# Patient Record
Sex: Male | Born: 1975
Health system: Southern US, Community
[De-identification: ages and names within clinical notes are randomized; demographics above are authoritative.]

## PROBLEM LIST (undated history)

## (undated) DIAGNOSIS — R519 Headache, unspecified: Secondary | ICD-10-CM

## (undated) DIAGNOSIS — R05 Cough: Secondary | ICD-10-CM

## (undated) DIAGNOSIS — R059 Cough, unspecified: Secondary | ICD-10-CM

## (undated) DIAGNOSIS — R0602 Shortness of breath: Secondary | ICD-10-CM

## (undated) HISTORY — DX: Shortness of breath: R06.02

## (undated) HISTORY — DX: Headache, unspecified: R51.9

## (undated) HISTORY — DX: Cough, unspecified: R05.9

## (undated) HISTORY — PX: APPENDECTOMY: SHX54

## (undated) HISTORY — PX: OTHER SURGICAL HISTORY: SHX169

---
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
## 2005-03-21 ENCOUNTER — Emergency Department (HOSPITAL_COMMUNITY): Admission: EM | Admit: 2005-03-21 | Discharge: 2005-03-21 | Payer: Self-pay | Admitting: Emergency Medicine

## 2012-06-21 ENCOUNTER — Emergency Department (HOSPITAL_COMMUNITY): Payer: 59 | Admitting: Anesthesiology

## 2012-06-21 ENCOUNTER — Encounter (HOSPITAL_COMMUNITY): Payer: Self-pay | Admitting: Anesthesiology

## 2012-06-21 ENCOUNTER — Encounter (HOSPITAL_COMMUNITY)
Admission: EM | Disposition: A | Discharge status: Home / Self Care | Payer: Self-pay | Source: Home / Self Care | Attending: Emergency Medicine

## 2012-06-21 ENCOUNTER — Other Ambulatory Visit: Payer: Self-pay | Admitting: Family Medicine

## 2012-06-21 ENCOUNTER — Ambulatory Visit
Admission: RE | Admit: 2012-06-21 | Discharge: 2012-06-21 | Disposition: A | Discharge status: Home / Self Care | Payer: 59 | Source: Ambulatory Visit | Attending: Family Medicine | Admitting: Family Medicine

## 2012-06-21 ENCOUNTER — Observation Stay (HOSPITAL_COMMUNITY)
Admission: EM | Admit: 2012-06-21 | Discharge: 2012-06-22 | Disposition: A | Discharge status: Home / Self Care | Payer: 59 | Attending: General Surgery | Admitting: General Surgery

## 2012-06-21 ENCOUNTER — Encounter (HOSPITAL_COMMUNITY): Payer: Self-pay | Admitting: *Deleted

## 2012-06-21 DIAGNOSIS — R1031 Right lower quadrant pain: Secondary | ICD-10-CM

## 2012-06-21 DIAGNOSIS — K3580 Unspecified acute appendicitis: Principal | ICD-10-CM | POA: Insufficient documentation

## 2012-06-21 HISTORY — DX: Unspecified acute appendicitis: K35.80

## 2012-06-21 HISTORY — PX: LAPAROSCOPIC APPENDECTOMY: SHX408

## 2012-06-21 LAB — COMPREHENSIVE METABOLIC PANEL
Albumin: 4.5 g/dL (ref 3.5–5.2)
Alkaline Phosphatase: 62 U/L (ref 39–117)
BUN: 11 mg/dL (ref 6–23)
Chloride: 99 mEq/L (ref 96–112)
Creatinine, Ser: 1.59 mg/dL — ABNORMAL HIGH (ref 0.50–1.35)
GFR calc Af Amer: 63 mL/min — ABNORMAL LOW (ref >90)
GFR calc non Af Amer: 55 mL/min — ABNORMAL LOW (ref >90)
Glucose, Bld: 93 mg/dL (ref 70–99)
Potassium: 4.3 mEq/L (ref 3.5–5.1)
Total Bilirubin: 1.2 mg/dL (ref 0.3–1.2)

## 2012-06-21 LAB — URINALYSIS, ROUTINE W REFLEX MICROSCOPIC
Bilirubin Urine: NEGATIVE
Glucose, UA: NEGATIVE mg/dL
Ketones, ur: NEGATIVE mg/dL
Specific Gravity, Urine: <1.005 — ABNORMAL LOW (ref 1.005–1.030)
pH: 6.5 (ref 5.0–8.0)

## 2012-06-21 LAB — CBC WITH DIFFERENTIAL/PLATELET
Basophils Relative: 0 % (ref 0–1)
Eosinophils Absolute: 0 10*3/uL (ref 0.0–0.7)
HCT: 42.1 % (ref 39.0–52.0)
Hemoglobin: 14.8 g/dL (ref 13.0–17.0)
Lymphs Abs: 1.9 10*3/uL (ref 0.7–4.0)
MCH: 31.1 pg (ref 26.0–34.0)
MCHC: 35.2 g/dL (ref 30.0–36.0)
Monocytes Absolute: 0.9 10*3/uL (ref 0.1–1.0)
Monocytes Relative: 7 % (ref 3–12)
Neutro Abs: 9.4 10*3/uL — ABNORMAL HIGH (ref 1.7–7.7)
Neutrophils Relative %: 77 % (ref 43–77)
RBC: 4.76 MIL/uL (ref 4.22–5.81)

## 2012-06-21 SURGERY — APPENDECTOMY, LAPAROSCOPIC
Anesthesia: General | Site: Abdomen | Wound class: Contaminated

## 2012-06-21 MED ORDER — PROMETHAZINE HCL 25 MG/ML IJ SOLN: 6.2500 mg | INTRAMUSCULAR | Status: DC | PRN

## 2012-06-21 MED ORDER — OXYCODONE HCL 5 MG/5ML PO SOLN: 5.0000 mg | Freq: Once | ORAL | Status: DC | PRN

## 2012-06-21 MED ORDER — IOHEXOL 300 MG/ML  SOLN
30.0000 mL | Freq: Once | INTRAMUSCULAR | Status: AC | PRN
  Administered 2012-06-21: 30 mL via ORAL

## 2012-06-21 MED ORDER — ONDANSETRON HCL 4 MG/2ML IJ SOLN: 4.0000 mg | Freq: Four times a day (QID) | INTRAMUSCULAR | Status: DC | PRN

## 2012-06-21 MED ORDER — IOHEXOL 300 MG/ML  SOLN
125.0000 mL | Freq: Once | INTRAMUSCULAR | Status: AC | PRN
  Administered 2012-06-21: 125 mL via INTRAVENOUS

## 2012-06-21 MED ORDER — ONDANSETRON HCL 4 MG/2ML IJ SOLN
INTRAMUSCULAR | Status: DC | PRN
  Administered 2012-06-21: 4 mg via INTRAVENOUS

## 2012-06-21 MED ORDER — MEPERIDINE HCL 25 MG/ML IJ SOLN: 6.2500 mg | INTRAMUSCULAR | Status: DC | PRN

## 2012-06-21 MED ORDER — MORPHINE SULFATE 4 MG/ML IJ SOLN: 4.0000 mg | INTRAMUSCULAR | Status: DC | PRN

## 2012-06-21 MED ORDER — MIDAZOLAM HCL 5 MG/5ML IJ SOLN
INTRAMUSCULAR | Status: DC | PRN
  Administered 2012-06-21: 2 mg via INTRAVENOUS

## 2012-06-21 MED ORDER — NEOSTIGMINE METHYLSULFATE 1 MG/ML IJ SOLN
INTRAMUSCULAR | Status: DC | PRN
  Administered 2012-06-21: 5 mg via INTRAVENOUS

## 2012-06-21 MED ORDER — ROCURONIUM BROMIDE 100 MG/10ML IV SOLN
INTRAVENOUS | Status: DC | PRN
  Administered 2012-06-21: 20 mg via INTRAVENOUS
  Administered 2012-06-21: 30 mg via INTRAVENOUS

## 2012-06-21 MED ORDER — SODIUM CHLORIDE 0.9 % IR SOLN
Status: DC | PRN
  Administered 2012-06-21: 1000 mL

## 2012-06-21 MED ORDER — FENTANYL CITRATE 0.05 MG/ML IJ SOLN
INTRAMUSCULAR | Status: DC | PRN
  Administered 2012-06-21: 50 ug via INTRAVENOUS
  Administered 2012-06-21 (×2): 100 ug via INTRAVENOUS

## 2012-06-21 MED ORDER — SUCCINYLCHOLINE CHLORIDE 20 MG/ML IJ SOLN
INTRAMUSCULAR | Status: DC | PRN
  Administered 2012-06-21: 100 mg via INTRAVENOUS

## 2012-06-21 MED ORDER — BUPIVACAINE-EPINEPHRINE 0.25% -1:200000 IJ SOLN
INTRAMUSCULAR | Status: DC | PRN
  Administered 2012-06-21: 30 mL

## 2012-06-21 MED ORDER — ONDANSETRON HCL 4 MG PO TABS: 4.0000 mg | ORAL_TABLET | Freq: Four times a day (QID) | ORAL | Status: DC | PRN

## 2012-06-21 MED ORDER — GLYCOPYRROLATE 0.2 MG/ML IJ SOLN
INTRAMUSCULAR | Status: DC | PRN
  Administered 2012-06-21: 1 mg via INTRAVENOUS

## 2012-06-21 MED ORDER — PROPOFOL 10 MG/ML IV BOLUS
INTRAVENOUS | Status: DC | PRN
  Administered 2012-06-21: 200 mg via INTRAVENOUS

## 2012-06-21 MED ORDER — LACTATED RINGERS IV SOLN
INTRAVENOUS | Status: DC | PRN
  Administered 2012-06-21 (×2): via INTRAVENOUS

## 2012-06-21 MED ORDER — HYDROMORPHONE HCL PF 1 MG/ML IJ SOLN: 0.2500 mg | INTRAMUSCULAR | Status: DC | PRN

## 2012-06-21 MED ORDER — LIDOCAINE HCL (CARDIAC) 20 MG/ML IV SOLN
INTRAVENOUS | Status: DC | PRN
  Administered 2012-06-21: 100 mg via INTRAVENOUS

## 2012-06-21 MED ORDER — KCL IN DEXTROSE-NACL 20-5-0.9 MEQ/L-%-% IV SOLN
INTRAVENOUS | Status: DC
  Administered 2012-06-22 (×2): via INTRAVENOUS
  Filled 2012-06-21 (×4): qty 1000

## 2012-06-21 MED ORDER — SODIUM CHLORIDE 0.9 % IV SOLN
3.0000 g | Freq: Once | INTRAVENOUS | Status: AC
  Administered 2012-06-21: 3 g via INTRAVENOUS
  Filled 2012-06-21: qty 3

## 2012-06-21 MED ORDER — OXYCODONE HCL 5 MG PO TABS: 5.0000 mg | ORAL_TABLET | Freq: Once | ORAL | Status: DC | PRN

## 2012-06-21 MED ORDER — PANTOPRAZOLE SODIUM 40 MG IV SOLR
40.0000 mg | INTRAVENOUS | Status: DC
  Administered 2012-06-21: 40 mg via INTRAVENOUS
  Filled 2012-06-21 (×2): qty 40

## 2012-06-21 SURGICAL SUPPLY — 40 items
ADH SKN CLS APL DERMABOND .7 (GAUZE/BANDAGES/DRESSINGS) ×1
APPLIER CLIP ROT 10 11.4 M/L (STAPLE) ×2
APR CLP MED LRG 11.4X10 (STAPLE) ×1
BAG SPEC RTRVL LRG 6X4 10 (ENDOMECHANICALS) ×1
BLADE SURG ROTATE 9660 (MISCELLANEOUS) ×2 IMPLANT
CANISTER SUCTION 2500CC (MISCELLANEOUS) ×2 IMPLANT
CHLORAPREP W/TINT 26ML (MISCELLANEOUS) ×2 IMPLANT
CLIP APPLIE ROT 10 11.4 M/L (STAPLE) ×1 IMPLANT
CLOTH BEACON ORANGE TIMEOUT ST (SAFETY) ×2 IMPLANT
COVER SURGICAL LIGHT HANDLE (MISCELLANEOUS) ×2 IMPLANT
CUTTER FLEX LINEAR 45M (STAPLE) ×2 IMPLANT
DECANTER SPIKE VIAL GLASS SM (MISCELLANEOUS) ×2 IMPLANT
DERMABOND ADVANCED (GAUZE/BANDAGES/DRESSINGS) ×1
DERMABOND ADVANCED .7 DNX12 (GAUZE/BANDAGES/DRESSINGS) ×1 IMPLANT
DRAPE UTILITY 15X26 W/TAPE STR (DRAPE) ×4 IMPLANT
ELECT REM PT RETURN 9FT ADLT (ELECTROSURGICAL) ×2
ELECTRODE REM PT RTRN 9FT ADLT (ELECTROSURGICAL) ×1 IMPLANT
ENDOLOOP SUT PDS II  0 18 (SUTURE)
ENDOLOOP SUT PDS II 0 18 (SUTURE) IMPLANT
GLOVE BIO SURGEON STRL SZ7.5 (GLOVE) ×2 IMPLANT
GLOVE BIOGEL PI IND STRL 8 (GLOVE) ×2 IMPLANT
GLOVE BIOGEL PI INDICATOR 8 (GLOVE) ×2
GOWN STRL NON-REIN LRG LVL3 (GOWN DISPOSABLE) ×4 IMPLANT
KIT BASIN OR (CUSTOM PROCEDURE TRAY) ×2 IMPLANT
KIT ROOM TURNOVER OR (KITS) ×2 IMPLANT
NS IRRIG 1000ML POUR BTL (IV SOLUTION) ×2 IMPLANT
PAD ARMBOARD 7.5X6 YLW CONV (MISCELLANEOUS) ×4 IMPLANT
POUCH SPECIMEN RETRIEVAL 10MM (ENDOMECHANICALS) ×2 IMPLANT
RELOAD STAPLE TA45 3.5 REG BLU (ENDOMECHANICALS) ×2 IMPLANT
SCALPEL HARMONIC ACE (MISCELLANEOUS) ×2 IMPLANT
SET IRRIG TUBING LAPAROSCOPIC (IRRIGATION / IRRIGATOR) ×2 IMPLANT
SPECIMEN JAR SMALL (MISCELLANEOUS) ×2 IMPLANT
SUT MNCRL AB 4-0 PS2 18 (SUTURE) ×2 IMPLANT
TOWEL OR 17X24 6PK STRL BLUE (TOWEL DISPOSABLE) ×2 IMPLANT
TOWEL OR 17X26 10 PK STRL BLUE (TOWEL DISPOSABLE) ×2 IMPLANT
TRAY FOLEY CATH 14FR (SET/KITS/TRAYS/PACK) ×2 IMPLANT
TRAY LAPAROSCOPIC (CUSTOM PROCEDURE TRAY) ×2 IMPLANT
TROCAR XCEL BLUNT TIP 100MML (ENDOMECHANICALS) ×2 IMPLANT
TROCAR XCEL NON-BLD 5MMX100MML (ENDOMECHANICALS) ×4 IMPLANT
WATER STERILE IRR 1000ML POUR (IV SOLUTION) IMPLANT

## 2012-06-21 NOTE — ED Notes (Signed)
Patient said he started with the pain three days ago.  He started being nauseous and vomiting yesterday and he went to [facility] and he was told he had a virus.  He continued to hurt and the wife took him to [facility] practice and they sent him to get a CT scan.  There he was told his appendix had ruptured and he needed to come to the ED to get it taken care of.

## 2012-06-21 NOTE — Transfer of Care (Signed)
Immediate Anesthesia Transfer of Care Note  Patient: Bruce Bailey  Procedure(s) Performed: Procedure(s): APPENDECTOMY LAPAROSCOPIC (N/A)  Patient Location: PACU  Anesthesia Type:General  Level of Consciousness: awake and alert   Airway & Oxygen Therapy: Patient Spontanous Breathing and Patient connected to nasal cannula oxygen  Post-op Assessment: Report given to PACU RN and Post -op Vital signs reviewed and stable  Post vital signs: Reviewed and stable  Complications: No apparent anesthesia complications

## 2012-06-21 NOTE — Op Note (Signed)
06/21/2012  8:39 PM  PATIENT:  Bruce Bailey  37 y.o. male  PRE-OPERATIVE DIAGNOSIS:  appendicitis  POST-OPERATIVE DIAGNOSIS:  appendicitis  PROCEDURE:  Procedure(s): APPENDECTOMY LAPAROSCOPIC (N/A)  SURGEON:  Surgeon(s) and Role:    * Robyne Askew, MD - Primary  PHYSICIAN ASSISTANT:   ASSISTANTS: none   ANESTHESIA:   general  EBL:  Total I/O In: 1000 [I.V.:1000] Out: 300 [Urine:300]  BLOOD ADMINISTERED:none  DRAINS: none   LOCAL MEDICATIONS USED:  MARCAINE     SPECIMEN:  Source of Specimen:  appendix  DISPOSITION OF SPECIMEN:  PATHOLOGY  COUNTS:  YES  TOURNIQUET:  * No tourniquets in log *  DICTATION: .Dragon Dictation After informed consent was obtained patient was brought to the operating room placed in the supine position on the operating room table. After adequate induction of general anesthesia the patient's abdomen was prepped with ChloraPrep, allowed to dry, and draped in usual sterile manner. The area below the umbilicus was infiltrated with quarter percent Marcaine. A small incision was made with a 15 blade knife. This incision was carried down through the subcutaneous tissue bluntly with a hemostat and Army-Nav[employer] retractors until the linea alba was identified. The linea alba was incised with a 15 blade knife. Each side was grasped Coker clamps and elevated anteriorly. The preperitoneal space was probed bluntly with a hemostat until the peritoneum was opened and access was gained to the abdominal cavity. A 0 Vicryl purse string stitch was placed in the fascia surrounding the opening. A Hassan cannula was placed through the opening and anchored in place with the previously placed Vicryl purse string stitch. The laparoscope was placed through the [facility] cannula. The abdomen was insufflated with carbon dioxide without difficulty. nnext the suprapubic area was infiltrated with quarter percent Marcaine. A small incision was made with a 15 blade knife. A 5 mm port was  placed bluntly through this incision into the abdominal cavity. A site was then chosen between the 2 port for placement of a 5 mm port. The area was infiltrated with quarter percent Marcaine. A small stab incision was made with a 15 blade knife. A 5 mm port was placed bluntly through this incision and the abdominal cavity under direct vision. The laparoscope was then moved to the suprapubic port. Using a Glassman grasper and harmonic scalpel the right lower quadrant was inspected. The appendix was readily identified. The appendix was elevated anteriorly and the mesoappendix was taken down sharply with the harmonic scalpel. Once the base of the appendix where it joined the cecum was identified and cleared of any tissue then a laparoscopic GIA blue load 6 row stapler was placed through the [facility] cannula. The stapler was placed across the base of the appendix clamped and fired thereby dividing the base of the appendix between staple lines. A clip was placed across the staple line for hemostasis. A laparoscopic bag was then inserted through the [facility] cannula. The appendix was placed within the bag and the bag was sealed. The abdomen was then irrigated with copious amounts of saline until the effluent was clear. No other abnormalities were noted. The appendix and bag were removed with the [facility] cannula through the infraumbilical port without difficulty. The fascial defect was closed with the previously placed Vicryl pursestring stitch as well as with another interrupted 0 Vicryl figure-of-eight stitch. The rest of the ports were removed under direct vision and were found to be hemostatic. The gas was allowed to escape. The skin incisions were closed  with interrupted 4-0 Monocryl subcuticular stitches. Dermabond dressings were applied. The patient tolerated the procedure well. At the end of the case all needle sponge and instrument counts were correct. The patient was then awakened and taken to recovery in stable  condition.  PLAN OF CARE: Admit for overnight observation  PATIENT DISPOSITION:  PACU - hemodynamically stable.   Delay start of Pharmacological VTE agent (>24hrs) due to surgical blood loss or risk of bleeding: yes

## 2012-06-21 NOTE — H&P (Signed)
Bruce Bailey is an 37 y.o. male.   Chief Complaint: abdominal pain HPI: 37 yo bm who began having lower abdominal pain yesterday. Pain became severe. It has been associated with nausea and vomiting. He has had fevers. He came to ER. CT shows appendicitis but no perf.  History reviewed. No pertinent past medical history.  History reviewed. No pertinent past surgical history.  No family history on file. Social History:  reports that he has never smoked. He does not have any smokeless tobacco history on file. He reports that he does not drink alcohol. His drug history is not on file.  Allergies: No Known Allergies   (Not in a hospital admission)  Results for orders placed during the hospital encounter of 06/21/12 (from the past 48 hour(s))  URINALYSIS, ROUTINE W REFLEX MICROSCOPIC     Status: Abnormal   Collection Time    06/21/12  4:35 PM      Result Value Range   Color, Urine YELLOW  YELLOW   APPearance CLEAR  CLEAR   Specific Gravity, Urine <1.005 (*) 1.005 - 1.030   pH 6.5  5.0 - 8.0   Glucose, UA NEGATIVE  NEGATIVE mg/dL   Hgb urine dipstick NEGATIVE  NEGATIVE   Bilirubin Urine NEGATIVE  NEGATIVE   Ketones, ur NEGATIVE  NEGATIVE mg/dL   Protein, ur NEGATIVE  NEGATIVE mg/dL   Urobilinogen, UA 1.0  0.0 - 1.0 mg/dL   Nitrite NEGATIVE  NEGATIVE   Leukocytes, UA NEGATIVE  NEGATIVE   Comment: MICROSCOPIC NOT DONE ON URINES WITH NEGATIVE PROTEIN, BLOOD, LEUKOCYTES, NITRITE, OR GLUCOSE <1000 mg/dL.  CBC WITH DIFFERENTIAL     Status: Abnormal   Collection Time    06/21/12  5:01 PM      Result Value Range   WBC 12.2 (*) 4.0 - 10.5 K/uL   RBC 4.76  4.22 - 5.81 MIL/uL   Hemoglobin 14.8  13.0 - 17.0 g/dL   HCT 16.1  09.6 - 04.5 %   MCV 88.4  78.0 - 100.0 fL   MCH 31.1  26.0 - 34.0 pg   MCHC 35.2  30.0 - 36.0 g/dL   RDW 40.9  81.1 - 91.4 %   Platelets 181  150 - 400 K/uL   Neutrophils Relative 77  43 - 77 %   Neutro Abs 9.4 (*) 1.7 - 7.7 K/uL   Lymphocytes Relative 16  12 - 46 %    Lymphs Abs 1.9  0.7 - 4.0 K/uL   Monocytes Relative 7  3 - 12 %   Monocytes Absolute 0.9  0.1 - 1.0 K/uL   Eosinophils Relative 0  0 - 5 %   Eosinophils Absolute 0.0  0.0 - 0.7 K/uL   Basophils Relative 0  0 - 1 %   Basophils Absolute 0.0  0.0 - 0.1 K/uL  COMPREHENSIVE METABOLIC PANEL     Status: Abnormal   Collection Time    06/21/12  5:01 PM      Result Value Range   Sodium 138  135 - 145 mEq/L   Potassium 4.3  3.5 - 5.1 mEq/L   Chloride 99  96 - 112 mEq/L   CO2 31  19 - 32 mEq/L   Glucose, Bld 93  70 - 99 mg/dL   BUN 11  6 - 23 mg/dL   Creatinine, Ser 7.82 (*) 0.50 - 1.35 mg/dL   Calcium 9.7  8.4 - 95.6 mg/dL   Total Protein 7.7  6.0 - 8.3 g/dL  Albumin 4.5  3.5 - 5.2 g/dL   AST 47 (*) 0 - 37 U/L   ALT 41  0 - 53 U/L   Alkaline Phosphatase 62  39 - 117 U/L   Total Bilirubin 1.2  0.3 - 1.2 mg/dL   GFR calc non Af Amer 55 (*) >90 mL/min   GFR calc Af Amer 63 (*) >90 mL/min   Comment:            The eGFR has been calculated     using the CKD EPI equation.     This calculation has not been     validated in all clinical     situations.     eGFR's persistently     <90 mL/min signify     possible Chronic Kidney Disease.   Ct Abdomen Pelvis W Contrast  06/21/2012  *RADIOLOGY REPORT*  Clinical Data: Right lower quadrant pain and fever.  CT ABDOMEN AND PELVIS WITH CONTRAST  Technique:  Multidetector CT imaging of the abdomen and pelvis was performed following the standard protocol during bolus administration of intravenous contrast.  Contrast: 30mL OMNIPAQUE IOHEXOL 300 MG/ML  SOLN, OMNIPAQUE IOHEXOL 300 MG/ML  SOLN  Comparison: None.  Findings: The lung bases are clear without focal nodule, mass, or airspace disease.  The heart to size is upper limits of normal. There is no edema or effusions to suggest failure.  The liver and spleen are within normal limits.  The stomach, duodenum, and pancreas are within normal limits.  The common bile duct and gallbladder are normal.  The  adrenal glands are normal bilaterally.  The kidneys and ureters are unremarkable.  The rectosigmoid colon demonstrates mild diverticular changes without focal inflammation to suggest diverticulitis.  The more proximal colon is within normal limits.  The appendix is dilated, measuring up to 8.5 mm.  Inflammatory changes are present.  There is some free fluid extending into the dependent portions of the anatomic pelvis.  The urinary bladder is within normal limits.  No significant adenopathy is present.  The bone windows are unremarkable.  IMPRESSION:  1.  Findings compatible with acute appendicitis.  There is no evidence for abscess or free air. 2.  Layering fluid within the talar pelvis is likely reactive. 3.  Sigmoid diverticulosis without evidence for diverticulitis. 4.  Borderline cardiomegaly without failure.  These results were called by telephone on 06/21/2012 at 03:30 p.m. to Elvera Lennox, [place], who verbally acknowledged these results.   Original Report Authenticated By: Marin Roberts, M.D.     Review of Systems  Constitutional: Positive for fever.  HENT: Negative.   Eyes: Negative.   Respiratory: Negative.   Cardiovascular: Negative.   Gastrointestinal: Positive for nausea, vomiting and abdominal pain.  Genitourinary: Negative.   Musculoskeletal: Negative.   Skin: Negative.   Neurological: Negative.   Endo/Heme/Allergies: Negative.   Psychiatric/Behavioral: Negative.     Blood pressure 132/73, pulse 56, temperature 99.8 F (37.7 C), temperature source Oral, resp. rate 20, SpO2 98.00%. Physical Exam  Constitutional: He is oriented to person, place, and time. He appears well-developed and well-nourished.  HENT:  Head: Normocephalic and atraumatic.  Eyes: Conjunctivae and EOM are normal. Pupils are equal, round, and reactive to light.  Neck: Normal range of motion. Neck supple.  GI: Soft.  Tender over lower abdomen. Worse on right. There is guarding  Musculoskeletal: Normal range  of motion.  Neurological: He is alert and oriented to person, place, and time.  Skin: Skin is warm and dry.  Psychiatric: He has a normal mood and affect. His behavior is normal.     Assessment/Plan The pt appears to have acute appendicitis. Because of the risk of perforation and sepsis i think he should have his appendix removed tonight. I have discussed with him the risks and benefits of surgery as well as some of the technical aspects including the risk of leak and he understands and wishes to proceed.  TOTH III,Elanora Quin S 06/21/2012, 6:14 PM

## 2012-06-21 NOTE — ED Notes (Signed)
Surgeon in to see pt. [facility] has called to inquire about pt

## 2012-06-21 NOTE — Anesthesia Postprocedure Evaluation (Signed)
  Anesthesia Post-op Note  Patient: Bruce Bailey  Procedure(s) Performed: Procedure(s): APPENDECTOMY LAPAROSCOPIC (N/A)  Patient Location: PACU  Anesthesia Type:General  Level of Consciousness: awake  Airway and Oxygen Therapy: Patient Spontanous Breathing  Post-op Pain: mild  Post-op Assessment: Post-op Vital signs reviewed  Post-op Vital Signs: stable  Complications: No apparent anesthesia complications

## 2012-06-21 NOTE — ED Notes (Signed)
The pt was sen here from [facility] and an imaging diagnosis of acute appendicitis.  He has had abd pain for 2-3 days with nausea.  He last ate this am

## 2012-06-21 NOTE — ED Notes (Signed)
Spoke with cindy in [facility]. They will call for pt between 7 and 730

## 2012-06-21 NOTE — ED Provider Notes (Signed)
History     CSN: [id number]  Arrival date & time 06/21/12  1622   First MD Initiated Contact with Patient 06/21/12 1637      Chief Complaint  Patient presents with  . Abdominal Pain    (Consider location/radiation/quality/duration/timing/severity/associated sxs/prior treatment) HPI Comments: 36 year old male with no significant past medical history presents with a complaint of lower abdominal pain which is suprapubic and right lower quadrant. This has been persistent for the last 2 days, gradually getting worse and associated with nausea. He has presented from his family doctor's office and the radiologist center where he had a CT scan confirming appendicitis with no signs of rupture or free air. He denies any prior abdominal surgery, pain is moderate to severe, worse with palpation and movement.  Patient is a 37 y.o. male presenting with abdominal pain. The history is provided by the patient.  Abdominal Pain Associated symptoms: nausea   Associated symptoms: no chest pain, no chills, no cough, no diarrhea, no dysuria, no fever, no shortness of breath, no sore throat and no vomiting     History reviewed. No pertinent past medical history.  Past Surgical History  Procedure Laterality Date  . Other surgical history Bilateral     ACL times 3    History reviewed. No pertinent family history.  History  Substance Use Topics  . Smoking status: Never Smoker   . Smokeless tobacco: Not on file  . Alcohol Use: No      Review of Systems  Constitutional: Negative for fever and chills.  HENT: Negative for sore throat and neck pain.   Eyes: Negative for visual disturbance.  Respiratory: Negative for cough and shortness of breath.   Cardiovascular: Negative for chest pain.  Gastrointestinal: Positive for nausea and abdominal pain. Negative for vomiting and diarrhea.  Genitourinary: Negative for dysuria and frequency.  Musculoskeletal: Negative for back pain.  Skin: Negative for  rash.  Neurological: Negative for weakness, numbness and headaches.  Hematological: Negative for adenopathy.  Psychiatric/Behavioral: Negative for behavioral problems.    Allergies  Review of patient's allergies indicates no known allergies.  Home Medications   Current Outpatient Rx  Name  Route  Sig  Dispense  Refill  . oxyCODONE-acetaminophen (PERCOCET/ROXICET) 5-325 MG per tablet   Oral   Take 1-2 tablets by mouth every 6 (six) hours as needed.   40 tablet   0     BP 126/58  Pulse 65  Temp(Src) 99.3 F (37.4 C) (Oral)  Resp 17  Ht 5\' 8"  (1.727 m)  Wt 225 lb (102.059 kg)  BMI 34.22 kg/m2  SpO2 97%  Physical Exam  Nursing note and vitals reviewed. Constitutional: He appears well-developed and well-nourished. No distress.  HENT:  Head: Normocephalic and atraumatic.  Mouth/Throat: Oropharynx is clear and moist. No oropharyngeal exudate.  Eyes: Conjunctivae and EOM are normal. Pupils are equal, round, and reactive to light. Right eye exhibits no discharge. Left eye exhibits no discharge. No scleral icterus.  Neck: Normal range of motion. Neck supple. No JVD present. No thyromegaly present.  Cardiovascular: Normal rate, regular rhythm, normal heart sounds and intact distal pulses.  Exam reveals no gallop and no friction rub.   No murmur heard. Pulmonary/Chest: Effort normal and breath sounds normal. No respiratory distress. He has no wheezes. He has no rales.  Abdominal: Soft. Bowel sounds are normal. He exhibits no distension and no mass. There is tenderness ( Suprapubic and right lower quadrant tenderness with guarding, upper abdomen and left side of  abdomen with minimal tenderness).  No peritoneal signs  Musculoskeletal: Normal range of motion. He exhibits no edema and no tenderness.  Lymphadenopathy:    He has no cervical adenopathy.  Neurological: He is alert. Coordination normal.  Skin: Skin is warm and dry. No rash noted. No erythema.  Psychiatric: He has a normal  mood and affect. His behavior is normal.    ED Course  Procedures (including critical care time)  Labs Reviewed  URINALYSIS, ROUTINE W REFLEX MICROSCOPIC - Abnormal; Notable for the following:    Specific Gravity, Urine <1.005 (*)    All other components within normal limits  CBC WITH DIFFERENTIAL - Abnormal; Notable for the following:    WBC 12.2 (*)    Neutro Abs 9.4 (*)    All other components within normal limits  COMPREHENSIVE METABOLIC PANEL - Abnormal; Notable for the following:    Creatinine, Ser 1.59 (*)    AST 47 (*)    GFR calc non Af Amer 55 (*)    GFR calc Af Amer 63 (*)    All other components within normal limits  BASIC METABOLIC PANEL   Ct Abdomen Pelvis W Contrast  06/21/2012  *RADIOLOGY REPORT*  Clinical Data: Right lower quadrant pain and fever.  CT ABDOMEN AND PELVIS WITH CONTRAST  Technique:  Multidetector CT imaging of the abdomen and pelvis was performed following the standard protocol during bolus administration of intravenous contrast.  Contrast: 30mL OMNIPAQUE IOHEXOL 300 MG/ML  SOLN, OMNIPAQUE IOHEXOL 300 MG/ML  SOLN  Comparison: None.  Findings: The lung bases are clear without focal nodule, mass, or airspace disease.  The heart to size is upper limits of normal. There is no edema or effusions to suggest failure.  The liver and spleen are within normal limits.  The stomach, duodenum, and pancreas are within normal limits.  The common bile duct and gallbladder are normal.  The adrenal glands are normal bilaterally.  The kidneys and ureters are unremarkable.  The rectosigmoid colon demonstrates mild diverticular changes without focal inflammation to suggest diverticulitis.  The more proximal colon is within normal limits.  The appendix is dilated, measuring up to 8.5 mm.  Inflammatory changes are present.  There is some free fluid extending into the dependent portions of the anatomic pelvis.  The urinary bladder is within normal limits.  No significant  adenopathy is present.  The bone windows are unremarkable.  IMPRESSION:  1.  Findings compatible with acute appendicitis.  There is no evidence for abscess or free air. 2.  Layering fluid within the talar pelvis is likely reactive. 3.  Sigmoid diverticulosis without evidence for diverticulitis. 4.  Borderline cardiomegaly without failure.  These results were called by telephone on 06/21/2012 at 03:30 p.m. to Elvera Lennox, [place], who verbally acknowledged these results.   Original Report Authenticated By: Marin Roberts, M.D.      1. Acute appendicitis       MDM  Abdominal exam is consistent with a diagnosis of acute appendicitis. The patient appears hemodynamically stable, no other medical problems, temperature is 99.5, IV line, n.p.o., antibiotics, consult with general surgery and basic labs ordered.  I have reviewed the CT scan, diagnosis of acute appendicitis has been verified, discussed with general surgeon who has admitted the patient to the hospital for further treatment.      Vida Roller, MD 06/22/12 [id number]

## 2012-06-21 NOTE — Preoperative (Signed)
Beta Blockers   Reason not to administer Beta Blockers:Not Applicable. No home beta blockers 

## 2012-06-21 NOTE — Anesthesia Preprocedure Evaluation (Addendum)
Anesthesia Evaluation  Patient identified by MRN, date of birth, ID band Patient awake    Reviewed: Allergy & Precautions, H&P , NPO status , Patient's Chart, lab work & pertinent test results, reviewed documented beta blocker date and time   History of Anesthesia Complications Negative for: history of anesthetic complications  Airway Mallampati: I      Dental no notable dental hx. (+) Dental Advisory Given   Pulmonary neg pulmonary ROS,  breath sounds clear to auscultation  Pulmonary exam normal       Cardiovascular negative cardio ROS  IRhythm:regular Rate:Normal     Neuro/Psych negative neurological ROS  negative psych ROS   GI/Hepatic negative GI ROS, Neg liver ROS,   Endo/Other  negative endocrine ROS  Renal/GU negative Renal ROS  negative genitourinary   Musculoskeletal   Abdominal   Peds  Hematology negative hematology ROS (+)   Anesthesia Other Findings   Reproductive/Obstetrics negative OB ROS                          Anesthesia Physical Anesthesia Plan  ASA: I and emergent  Anesthesia Plan: General and General ETT   Post-op Pain Management:    Induction:   Airway Management Planned:   Additional Equipment:   Intra-op Plan:   Post-operative Plan: Extubation in OR  Informed Consent: I have reviewed the patients History and Physical, chart, labs and discussed the procedure including the risks, benefits and alternatives for the proposed anesthesia with the patient or authorized representative who has indicated his/her understanding and acceptance.   Dental advisory given  Plan Discussed with: CRNA and Surgeon  Anesthesia Plan Comments:        Anesthesia Quick Evaluation

## 2012-06-22 ENCOUNTER — Encounter (HOSPITAL_COMMUNITY): Payer: Self-pay | Admitting: General Surgery

## 2012-06-22 LAB — BASIC METABOLIC PANEL
BUN: 11 mg/dL (ref 6–23)
CO2: 31 mEq/L (ref 19–32)
Chloride: 102 mEq/L (ref 96–112)
Creatinine, Ser: 1.66 mg/dL — ABNORMAL HIGH (ref 0.50–1.35)
GFR calc Af Amer: 60 mL/min — ABNORMAL LOW (ref >90)
Potassium: 4 mEq/L (ref 3.5–5.1)

## 2012-06-22 MED ORDER — OXYCODONE-ACETAMINOPHEN 5-325 MG PO TABS
1.0000 | ORAL_TABLET | Freq: Four times a day (QID) | ORAL | Status: DC | PRN
  Administered 2012-06-22: 2 via ORAL
  Filled 2012-06-22: qty 2

## 2012-06-22 MED ORDER — OXYCODONE-ACETAMINOPHEN 5-325 MG PO TABS: 1.0000 | ORAL_TABLET | Freq: Four times a day (QID) | ORAL | Status: DC | PRN

## 2012-06-22 NOTE — Discharge Summary (Signed)
Physician Discharge Summary  Patient ID: Quency Tober MRN: [id number] DOB/AGE: [birth year] 37 y.o.  Admit date: 06/21/2012 Discharge date: 06/22/2012  Admitting Diagnosis: Acute appendicitis  Discharge Diagnosis Patient Active Problem List   Diagnosis Date Noted  . Acute appendicitis without mention of peritonitis 06/21/2012    Consultants None  Imaging: Ct Abdomen Pelvis W Contrast  06/21/2012  *RADIOLOGY REPORT*  Clinical Data: Right lower quadrant pain and fever.  CT ABDOMEN AND PELVIS WITH CONTRAST  Technique:  Multidetector CT imaging of the abdomen and pelvis was performed following the standard protocol during bolus administration of intravenous contrast.  Contrast: 30mL OMNIPAQUE IOHEXOL 300 MG/ML  SOLN, OMNIPAQUE IOHEXOL 300 MG/ML  SOLN  Comparison: None.  Findings: The lung bases are clear without focal nodule, mass, or airspace disease.  The heart to size is upper limits of normal. There is no edema or effusions to suggest failure.  The liver and spleen are within normal limits.  The stomach, duodenum, and pancreas are within normal limits.  The common bile duct and gallbladder are normal.  The adrenal glands are normal bilaterally.  The kidneys and ureters are unremarkable.  The rectosigmoid colon demonstrates mild diverticular changes without focal inflammation to suggest diverticulitis.  The more proximal colon is within normal limits.  The appendix is dilated, measuring up to 8.5 mm.  Inflammatory changes are present.  There is some free fluid extending into the dependent portions of the anatomic pelvis.  The urinary bladder is within normal limits.  No significant adenopathy is present.  The bone windows are unremarkable.  IMPRESSION:  1.  Findings compatible with acute appendicitis.  There is no evidence for abscess or free air. 2.  Layering fluid within the talar pelvis is likely reactive. 3.  Sigmoid diverticulosis without evidence for diverticulitis. 4.  Borderline  cardiomegaly without failure.  These results were called by telephone on 06/21/2012 at 03:30 p.m. to Elvera Lennox, [place], who verbally acknowledged these results.   Original Report Authenticated By: Marin Roberts, M.D.     Procedures Laparoscopic Appendectomy (Dr. Carolynne Edouard - 06/21/12)  Hospital Course:  37 y/o AA male who began having lower abdominal pain on 06/20/12. Pain became severe. It has been associated with nausea and vomiting. He has had fevers. He came to [facility]. CT shows appendicitis but no perf.  Patient was admitted and underwent procedure listed above.  Tolerated procedure well and was transferred to the floor.  Diet was advanced as tolerated.  The patient had an elevated creatinine at 1.59 which slightly worsened to 1.66 on POD#1.  He was given D5N with 20K with 54mL/hour out.  On POD#1, the patient was voiding well, tolerating diet, ambulating well, pain well controlled, vital signs stable, incisions c/d/i and felt stable for discharge home.  Patient will follow up in our office in 2 weeks and knows to call with questions or concerns.  He was advised to get a repeat BMET in 1 week to check his improvement in his creatinine level.  He will also get an appt with his PCP soon.    Physical Exam: General:  Alert, NAD, pleasant, comfortable Abd:  Soft, ND, mild tenderness, incisions C/D/I    Medication List    TAKE these medications       oxyCODONE-acetaminophen 5-325 MG per tablet  Commonly known as:  PERCOCET/ROXICET  Take 1-2 tablets by mouth every 6 (six) hours as needed.             Follow-up Information  Follow up with [facility] On 07/10/2012. (APPT AT 11:15AM, PLEASE ARRIVE AT 10:45 FOR CHECK IN)    Contact information:   [address]   [place] [address] [phone]       Signed: Candiss Norse [facility] [phone]  06/22/2012, 7:50 AM

## 2012-06-22 NOTE — Progress Notes (Signed)
UR completed 

## 2012-06-28 ENCOUNTER — Other Ambulatory Visit (INDEPENDENT_AMBULATORY_CARE_PROVIDER_SITE_OTHER): Payer: Self-pay | Admitting: General Surgery

## 2012-06-28 LAB — BASIC METABOLIC PANEL
CO2: 30 mEq/L (ref 19–32)
Chloride: 105 mEq/L (ref 96–112)
Glucose, Bld: 87 mg/dL (ref 70–99)
Sodium: 141 mEq/L (ref 135–145)

## 2012-07-03 ENCOUNTER — Telehealth (INDEPENDENT_AMBULATORY_CARE_PROVIDER_SITE_OTHER): Payer: Self-pay

## 2012-07-03 NOTE — Telephone Encounter (Signed)
Patient called back and I made him aware lab work normal. He will call with any problems/questions.

## 2012-07-03 NOTE — Telephone Encounter (Signed)
LMOM to call back. Labs came back normal. Kidney function back up.

## 2012-07-10 ENCOUNTER — Ambulatory Visit (INDEPENDENT_AMBULATORY_CARE_PROVIDER_SITE_OTHER): Payer: 59 | Admitting: Internal Medicine

## 2012-07-10 ENCOUNTER — Encounter (INDEPENDENT_AMBULATORY_CARE_PROVIDER_SITE_OTHER): Payer: Self-pay | Admitting: Internal Medicine

## 2012-07-10 VITALS — BP 122/64 | HR 60 | Temp 98.4°F | Resp 12 | Ht 68.0 in | Wt 217.8 lb

## 2012-07-10 DIAGNOSIS — K3580 Unspecified acute appendicitis: Secondary | ICD-10-CM

## 2012-07-10 NOTE — Patient Instructions (Addendum)
May resume regular activity without restrictions. Follow up as needed. Call with questions or concerns.  

## 2012-07-10 NOTE — Progress Notes (Signed)
  Subjective: Pt returns to the clinic today after undergoing laparoscopic appendectomy on 06/21/12 by Dr. Carolynne Edouard.  The patient is tolerating their diet well and is having no severe pain.  Bowel function is good.  No problems with the wounds.  Objective: Vital signs in last 24 hours: Reviewed  PE: Abd: soft, non-tender, +bs, incisions well healed  Lab Results:  No results found for this basename: WBC, HGB, HCT, PLT,  in the last 72 hours BMET No results found for this basename: NA, K, CL, CO2, GLUCOSE, BUN, CREATININE, CALCIUM,  in the last 72 hours PT/INR No results found for this basename: LABPROT, INR,  in the last 72 hours CMP     Component Value Date/Time   NA 141 06/28/2012 1558   K 4.0 06/28/2012 1558   CL 105 06/28/2012 1558   CO2 30 06/28/2012 1558   GLUCOSE 87 06/28/2012 1558   BUN 9 06/28/2012 1558   CREATININE 1.19 06/28/2012 1558   CREATININE 1.66* 06/22/2012 0815   CALCIUM 9.6 06/28/2012 1558   PROT 7.7 06/21/2012 1701   ALBUMIN 4.5 06/21/2012 1701   AST 47* 06/21/2012 1701   ALT 41 06/21/2012 1701   ALKPHOS 62 06/21/2012 1701   BILITOT 1.2 06/21/2012 1701   GFRNONAA 52* 06/22/2012 0815   GFRAA 60* 06/22/2012 0815   Lipase  No results found for this basename: lipase       Studies/Results: No results found.  Anti-infectives: Anti-infectives   None       Assessment/Plan  1.  S/P Laparoscopic Appendectomy: doing well, may resume regular activity without restrictions, Pt will follow up with [place] [facility] and knows to call with questions or concerns.     Hykeem Ojeda 07/10/2012

## 2013-01-04 ENCOUNTER — Emergency Department (INDEPENDENT_AMBULATORY_CARE_PROVIDER_SITE_OTHER)
Admission: EM | Admit: 2013-01-04 | Discharge: 2013-01-04 | Disposition: A | Discharge status: Home / Self Care | Payer: 59 | Source: Home / Self Care | Attending: Family Medicine | Admitting: Family Medicine

## 2013-01-04 ENCOUNTER — Encounter (HOSPITAL_COMMUNITY): Payer: Self-pay | Admitting: Emergency Medicine

## 2013-01-04 DIAGNOSIS — K130 Diseases of lips: Secondary | ICD-10-CM

## 2013-01-04 MED ORDER — CEPHALEXIN 500 MG PO CAPS: 500.0000 mg | ORAL_CAPSULE | Freq: Four times a day (QID) | ORAL | Status: DC

## 2013-01-04 NOTE — ED Notes (Signed)
Patient squeezed a bump below lip, followed by lower lip swelling on right side

## 2013-01-04 NOTE — ED Provider Notes (Signed)
CSN: [id number]     Arrival date & time 01/04/13  4098 History   First MD Initiated Contact with Patient 01/04/13 [phone]     Chief Complaint  Patient presents with  . Wound Infection   (Consider location/radiation/quality/duration/timing/severity/associated sxs/prior Treatment) Patient is a 37 y.o. male presenting with general illness.  Illness Quality:  Lower lip Severity:  Moderate Onset quality:  Gradual Duration:  2 days Progression:  Unchanged Chronicity:  New Context:  Saw bump on lip and squeezed it ad drainage came out, , repeated yest, today lip swollen. Associated symptoms: no fever and no sore throat     History reviewed. No pertinent past medical history. Past Surgical History  Procedure Laterality Date  . Other surgical history Bilateral     ACL times 3  . Laparoscopic appendectomy N/A 06/21/2012    Procedure: APPENDECTOMY LAPAROSCOPIC;  Surgeon: Robyne Askew, MD;  Location: [facility] OR;  Service: General;  Laterality: N/A;   No family history on file. History  Substance Use Topics  . Smoking status: Never Smoker   . Smokeless tobacco: Not on file  . Alcohol Use: No    Review of Systems  Constitutional: Negative.  Negative for fever.  HENT: Negative for dental problem, sore throat and trouble swallowing.   Respiratory: Negative.     Allergies  Review of patient's allergies indicates no known allergies.  Home Medications   Current Outpatient Rx  Name  Route  Sig  Dispense  Refill  . cephALEXin (KEFLEX) 500 MG capsule   Oral   Take 1 capsule (500 mg total) by mouth 4 (four) times daily. Take all of medicine and drink lots of fluids   28 capsule   0    BP 122/82  Pulse 52  Temp(Src) 99.5 F (37.5 C) (Oral)  Resp 16  SpO2 95% Physical Exam  Nursing note and vitals reviewed. Constitutional: He appears well-developed and well-nourished. No distress.  HENT:  Mouth/Throat: Oropharynx is clear and moist.    Neck: Normal range of motion. Neck supple.    Cardiovascular: Regular rhythm.   Pulmonary/Chest: Breath sounds normal.    ED Course  Procedures (including critical care time) Labs Review Labs Reviewed - No data to display Imaging Review No results found.  EKG Interpretation     Ventricular Rate:    PR Interval:    QRS Duration:   QT Interval:    QTC Calculation:   R Axis:     Text Interpretation:              MDM      Linna Hoff, MD 01/04/13 1027

## 2013-01-04 NOTE — ED Notes (Signed)
Requested work note.

## 2013-01-07 ENCOUNTER — Emergency Department (HOSPITAL_COMMUNITY)
Admission: EM | Admit: 2013-01-07 | Discharge: 2013-01-07 | Disposition: A | Discharge status: Home / Self Care | Payer: 59 | Attending: Emergency Medicine | Admitting: Emergency Medicine

## 2013-01-07 ENCOUNTER — Encounter (HOSPITAL_COMMUNITY): Payer: Self-pay | Admitting: Emergency Medicine

## 2013-01-07 DIAGNOSIS — K130 Diseases of lips: Secondary | ICD-10-CM

## 2013-01-07 MED ORDER — SULFAMETHOXAZOLE-TMP DS 800-160 MG PO TABS: 1.0000 | ORAL_TABLET | Freq: Two times a day (BID) | ORAL | Status: DC

## 2013-01-07 MED ORDER — SULFAMETHOXAZOLE-TMP DS 800-160 MG PO TABS
1.0000 | ORAL_TABLET | Freq: Once | ORAL | Status: AC
  Administered 2013-01-07: 1 via ORAL
  Filled 2013-01-07: qty 1

## 2013-01-07 MED ORDER — LIDOCAINE HCL (PF) 1 % IJ SOLN
5.0000 mL | Freq: Once | INTRAMUSCULAR | Status: AC
  Administered 2013-01-07: 5 mL
  Filled 2013-01-07: qty 5

## 2013-01-07 MED ORDER — BUPIVACAINE HCL 0.5 % IJ SOLN
50.0000 mL | Freq: Once | INTRAMUSCULAR | Status: AC
  Administered 2013-01-07: 50 mL
  Filled 2013-01-07: qty 50

## 2013-01-07 NOTE — ED Provider Notes (Signed)
Pt with swelling to the R lower lip - gradually worsneing - has palpable absess with fluctuance on exam, I and D performed by Ms Manus Rudd.  Medical screening examination/treatment/procedure(s) were conducted as a shared visit with non-physician practitioner(s) and myself.  I personally evaluated the patient during the encounter.  Clinical Impression: abscess of lower lip  Vida Roller, MD 01/07/13 [phone]

## 2013-01-07 NOTE — ED Provider Notes (Signed)
CSN: [id number]     Arrival date & time 01/07/13  0012 History   First MD Initiated Contact with Patient 01/07/13 0017     Chief Complaint  Patient presents with  . Abscess   (Consider location/radiation/quality/duration/timing/severity/associated sxs/prior Treatment) HPI Comments: Patient noticed a nodule just below his lower right lip on Thursday.  He squeezed it several times, getting pertinent material after the third attempt at squeezing it.  He noticed he had increased swelling, and pain.  He went to urgent care.  They started him on Keflex and ibuprofen.  Initially, the abscessed area appeared improved, and, then worsened.  Denies any fevers, myalgias, headache  Patient is a 37 y.o. male presenting with abscess. The history is provided by the patient.  Abscess Location:  Face and mouth Facial abscess location:  Lip Mouth abscess location:  Lower outer lip Abscess quality: fluctuance, painful, redness and warmth   Duration:  4 days Progression:  Worsening Pain details:    Quality:  Aching   Severity:  Moderate   Duration:  4 days   Timing:  Constant   Progression:  Worsening Relieved by:  Nothing Associated symptoms: no fever and no headaches     History reviewed. No pertinent past medical history. Past Surgical History  Procedure Laterality Date  . Other surgical history Bilateral     ACL times 3  . Laparoscopic appendectomy N/A 06/21/2012    Procedure: APPENDECTOMY LAPAROSCOPIC;  Surgeon: Robyne Askew, MD;  Location: [facility] OR;  Service: General;  Laterality: N/A;   No family history on file. History  Substance Use Topics  . Smoking status: Never Smoker   . Smokeless tobacco: Not on file  . Alcohol Use: No    Review of Systems  Constitutional: Negative for fever and chills.  HENT: Negative for trouble swallowing.   Skin: Positive for wound.  Neurological: Negative for dizziness and headaches.  All other systems reviewed and are negative.    Allergies    Review of patient's allergies indicates no known allergies.  Home Medications   Current Outpatient Rx  Name  Route  Sig  Dispense  Refill  . cephALEXin (KEFLEX) 500 MG capsule   Oral   Take 1 capsule (500 mg total) by mouth 4 (four) times daily. Take all of medicine and drink lots of fluids   28 capsule   0   . ibuprofen (ADVIL,MOTRIN) 200 MG tablet   Oral   Take 400 mg by mouth every 6 (six) hours as needed.         . sulfamethoxazole-trimethoprim (BACTRIM DS) 800-160 MG per tablet   Oral   Take 1 tablet by mouth 2 (two) times daily.   14 tablet   0    BP 136/94  Pulse 74  Temp(Src) 99.7 F (37.6 C) (Oral)  Resp 16  SpO2 98% Physical Exam  Nursing note and vitals reviewed. Constitutional: He appears well-developed and well-nourished.  HENT:  Head: Normocephalic.    Mouth/Throat: Oropharynx is clear and moist.  Swelling, she went to the lower half right side of his lip  Eyes: Pupils are equal, round, and reactive to light.  Neck: Normal range of motion.  Musculoskeletal: Normal range of motion.  Neurological: He is alert.  Skin: Skin is warm.    ED Course  INCISION AND DRAINAGE Date/Time: 01/07/2013 1:44 AM Performed by: Arman Filter Authorized by: Arman Filter Consent: Verbal consent obtained. written consent not obtained. Risks and benefits: risks, benefits and  alternatives were discussed Consent given by: patient Patient understanding: patient states understanding of the procedure being performed Patient identity confirmed: verbally with patient Type: abscess Body area: mouth (lower lip) Anesthesia: nerve block Local anesthetic: bupivacaine 0.5% without epinephrine Anesthetic total: 2 ml Scalpel size: 11 Needle gauge: 22 Incision type: single straight Complexity: simple Drainage: purulent Drainage amount: copious Wound treatment: wound left open Patient tolerance: Patient tolerated the procedure well with no immediate complications.    (including critical care time) Labs Review Labs Reviewed - No data to display Imaging Review No results found.  EKG Interpretation   None       MDM   1. Abscess of lip         Arman Filter, NP 01/07/13 0153  Arman Filter, NP 01/07/13 [phone]

## 2013-01-07 NOTE — ED Notes (Signed)
Pt to ED for evaluation of swelling to right side of lip- pt noticed swelling Thursday morning after "popping a white head"- admits to drainage from site afterwards"   Was seen at [facility] for evaluation of symptoms on Friday and given Kelfex- pt has been taking medication as prescribed- swelling went down yesterday but increased in size again today.  No respiratory distress noted.  Airway intact- respirations unlabored.

## 2014-05-25 IMAGING — CT CT ABD-PELV W/ CM
2 of 4 series · 17 of 46 positions shown, 19 images · IV contrast (30CC OMNI 300 & [ID] OMNI 350)
Comparison: None.

CLINICAL DATA: Right lower quadrant pain and fever.

CT ABDOMEN AND PELVIS WITH CONTRAST
TECHNIQUE: Multidetector CT imaging of the abdomen and pelvis was
performed following the standard protocol during bolus
administration of intravenous contrast.
Contrast: 30mL OMNIPAQUE IOHEXOL 300 MG/ML  SOLN, 125mL OMNIPAQUE
IOHEXOL 300 MG/ML  SOLN

[Series 2: abd/pelvis with · axial · 0.76mm/px · z∈[-385,+50]mm · 14 of 89 slices shown, 16 images]
[im 4/89  soft-tissue]
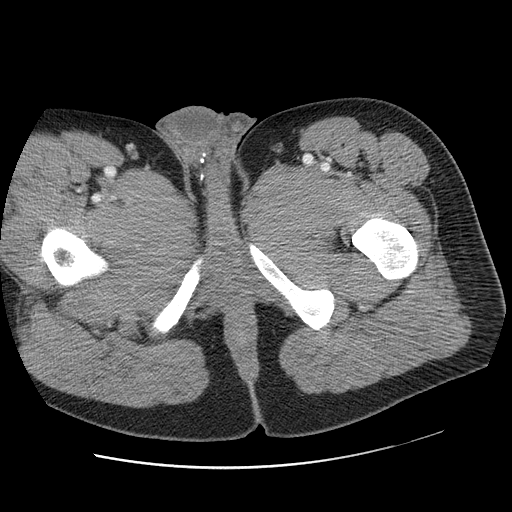
[im 4/89  bone]
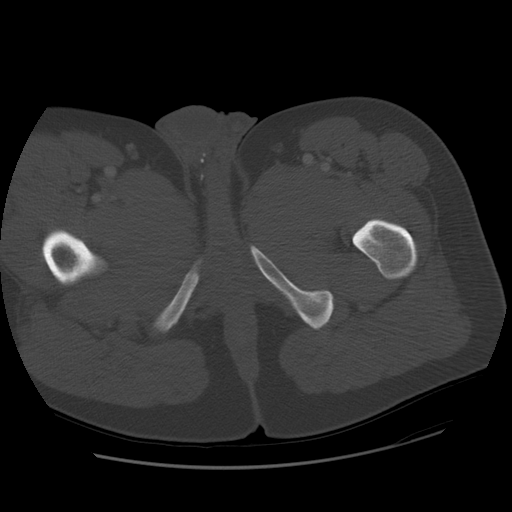
[im 12/89  soft-tissue]
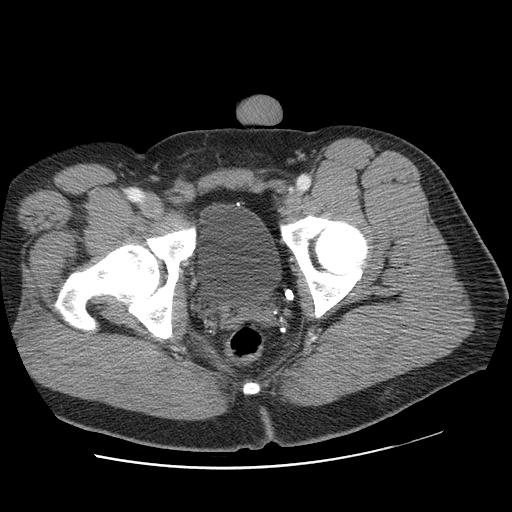
[im 19/89  soft-tissue]
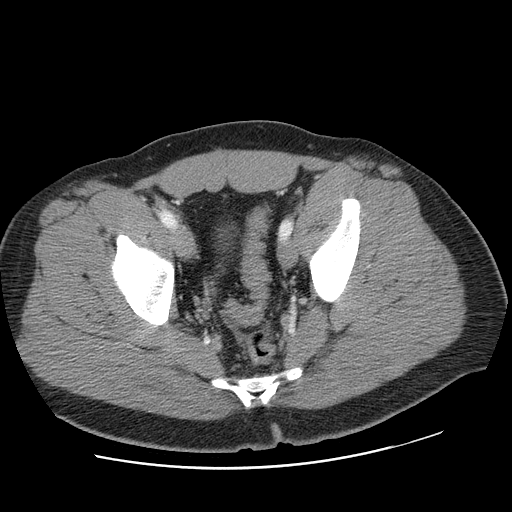
[im 23/89  soft-tissue]
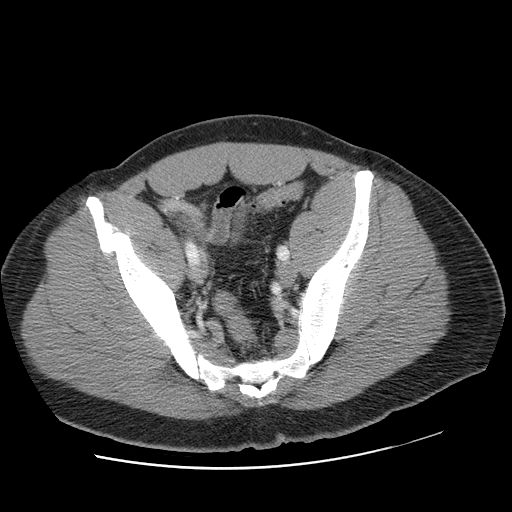
[im 30/89  soft-tissue]
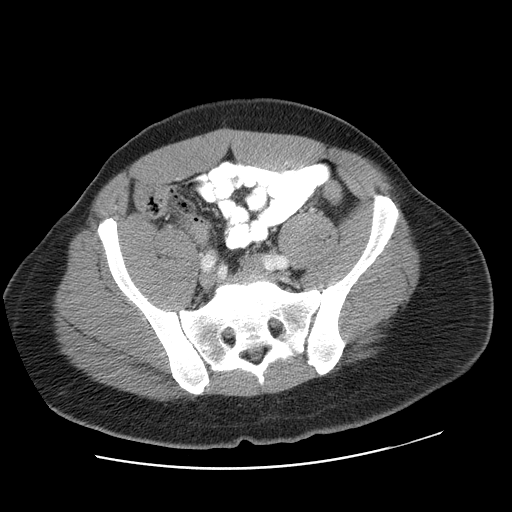
[im 37/89  soft-tissue]
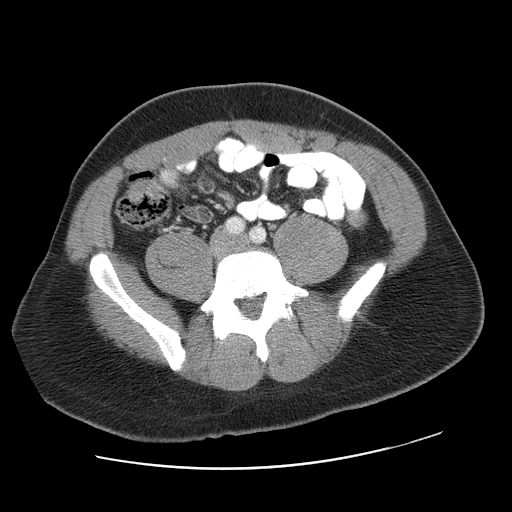
[im 41/89  soft-tissue]
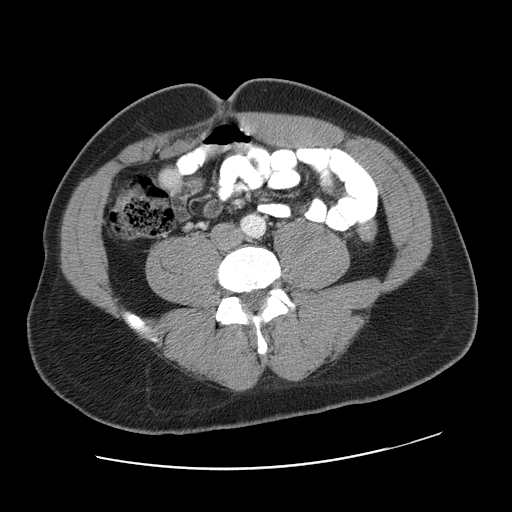
[im 48/89  soft-tissue]
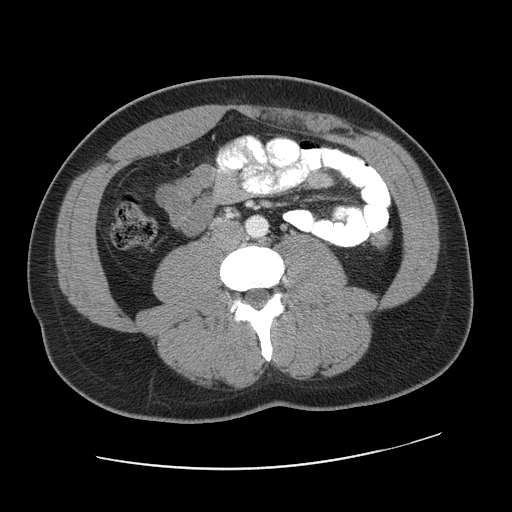
[im 52/89  soft-tissue]
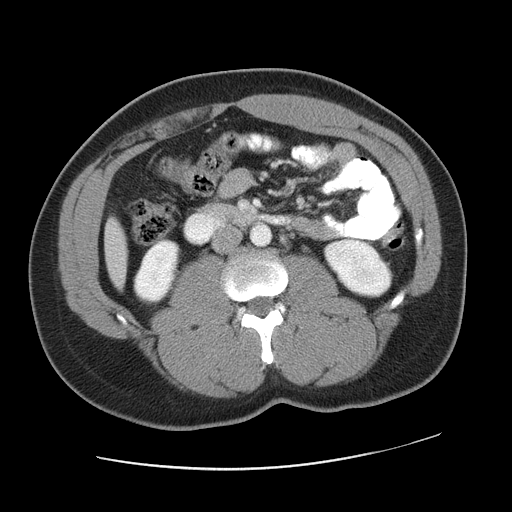
[im 52/89  bone]
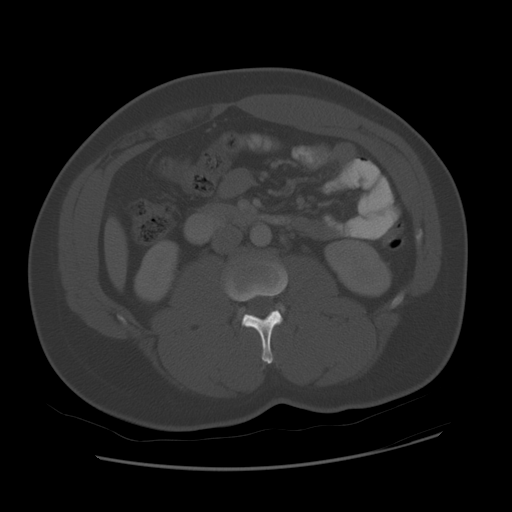
[im 59/89  soft-tissue]
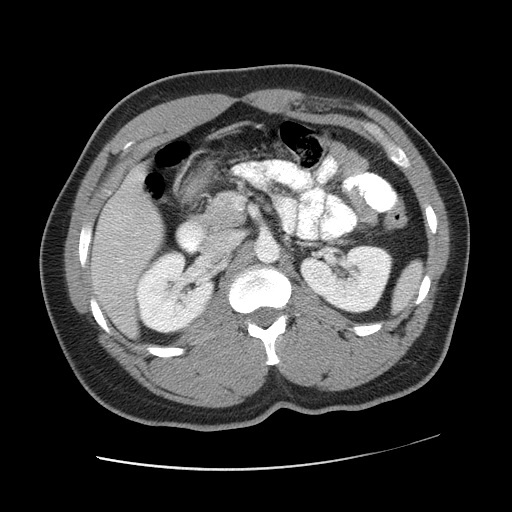
[im 67/89  soft-tissue]
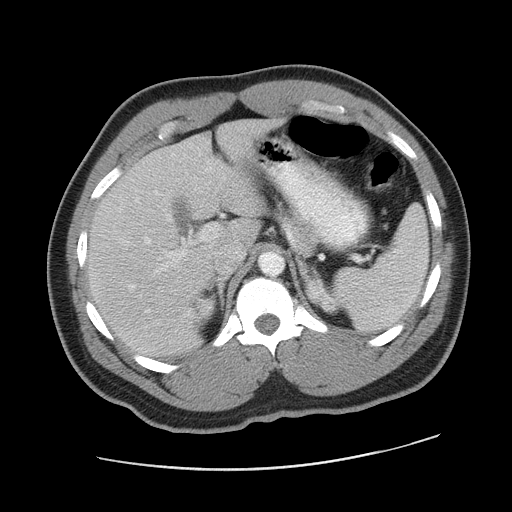
[im 70/89  soft-tissue]
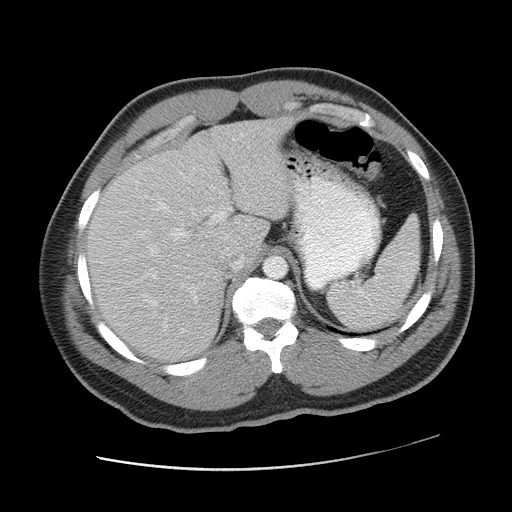
[im 78/89  soft-tissue]
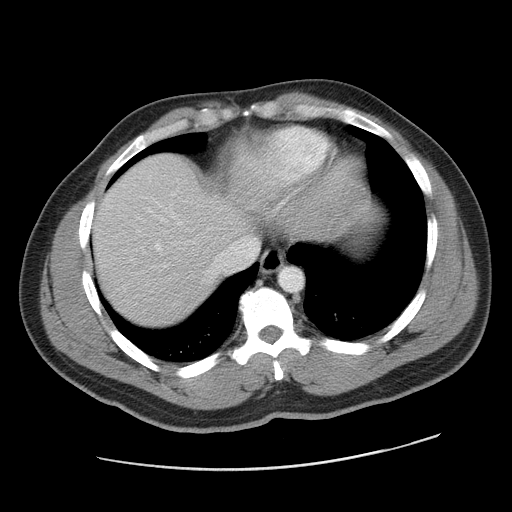
[im 85/89  soft-tissue]
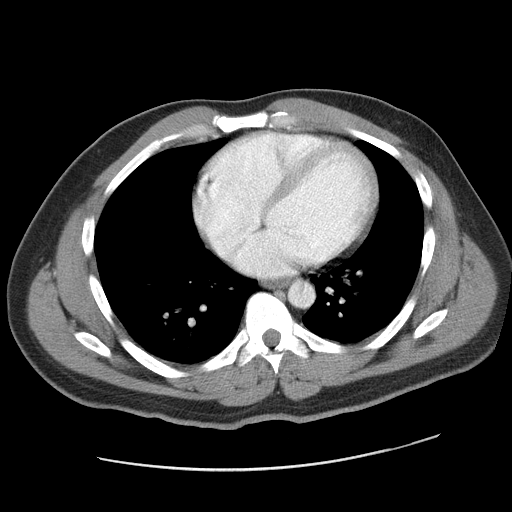

[Series 401: coronal · coronal · 0.95mm/px · 3 of 125 slices shown]
[im 42/125  soft-tissue]
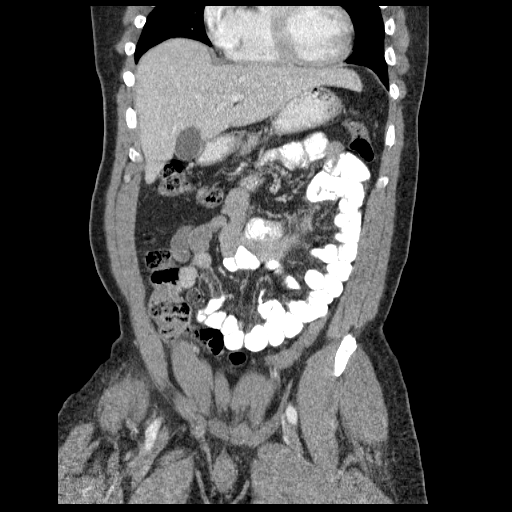
[im 56/125  soft-tissue]
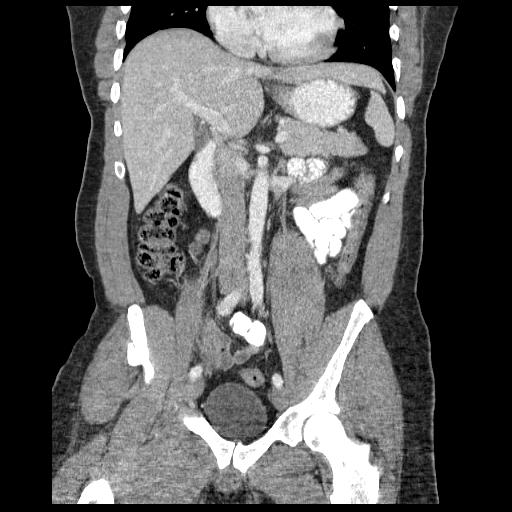
[im 69/125  soft-tissue]
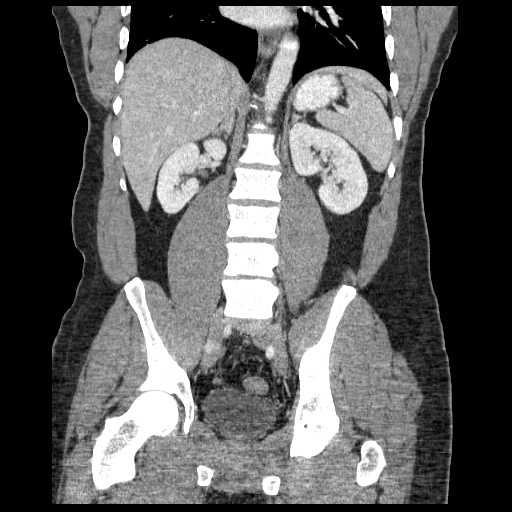

[17 of 46 positions shown; findings below may reference images not displayed]

FINDINGS: The lung bases are clear without focal nodule, mass, or
airspace disease.  The heart to size is upper limits of normal.
There is no edema or effusions to suggest failure.

The liver and spleen are within normal limits.  The stomach,
duodenum, and pancreas are within normal limits.  The common bile
duct and gallbladder are normal.  The adrenal glands are normal
bilaterally.  The kidneys and ureters are unremarkable.

The rectosigmoid colon demonstrates mild diverticular changes
without focal inflammation to suggest diverticulitis.  The more
proximal colon is within normal limits.  The appendix is dilated,
measuring up to 8.5 mm.  Inflammatory changes are present.  There
is some free fluid extending into the dependent portions of the
anatomic pelvis.  The urinary bladder is within normal limits.  No
significant adenopathy is present.

The bone windows are unremarkable.
IMPRESSION: 1.  Findings compatible with acute appendicitis.  There is no
evidence for abscess or free air.
2.  Layering fluid within the talar pelvis is likely reactive.
3.  Sigmoid diverticulosis without evidence for diverticulitis.
4.  Borderline cardiomegaly without failure.

to Sravya Hughley, PA, who verbally acknowledged these results.

## 2015-02-20 ENCOUNTER — Ambulatory Visit (INDEPENDENT_AMBULATORY_CARE_PROVIDER_SITE_OTHER): Payer: 59 | Admitting: Emergency Medicine

## 2015-02-20 VITALS — BP 132/80 | HR 74 | Temp 99.2°F | Resp 17 | Ht 68.0 in | Wt 234.0 lb

## 2015-02-20 DIAGNOSIS — J209 Acute bronchitis, unspecified: Secondary | ICD-10-CM

## 2015-02-20 DIAGNOSIS — J0140 Acute pansinusitis, unspecified: Secondary | ICD-10-CM

## 2015-02-20 MED ORDER — PSEUDOEPHEDRINE-GUAIFENESIN ER 60-600 MG PO TB12: 1.0000 | ORAL_TABLET | Freq: Two times a day (BID) | ORAL | Status: DC

## 2015-02-20 MED ORDER — AMOXICILLIN-POT CLAVULANATE 875-125 MG PO TABS: 1.0000 | ORAL_TABLET | Freq: Two times a day (BID) | ORAL | Status: DC

## 2015-02-20 MED ORDER — HYDROCOD POLST-CPM POLST ER 10-8 MG/5ML PO SUER: 5.0000 mL | Freq: Two times a day (BID) | ORAL | Status: DC

## 2015-02-20 NOTE — Progress Notes (Signed)
Subjective:  Patient ID: Bruce Bailey, male    DOB: 07/10/1975  Age: 39 y.o. MRN: [id number]  CC: Cough; URI; and Nasal Congestion   HPI Bruce GamblerRicky Bailey presents    patient is a history of or chills earlier in the week. He's been I am unable to  Work. Yesterday spent holding asleep he is a congestion and postnasal drainage. A purulent nasal drainage. His pressure in his cheeks and forehead. Off productive purulent sputum. Has no wheezing or shortness breath. No nausea vomiting. He has no stool change. Rash. He has no improvement with over-the-counter medication. Will contact  History Bruce CliffRicky has no past medical history on file.   He has past surgical history that includes Other surgical history (Bilateral) and laparoscopic appendectomy (N/A, 06/21/2012).   His  family history is not on file.  He   reports that he has never smoked. He does not have any smokeless tobacco history on file. He reports that he does not drink alcohol or use illicit drugs.  Outpatient Prescriptions Prior to Visit  Medication Sig Dispense Refill  . ibuprofen (ADVIL,MOTRIN) 200 MG tablet Take 400 mg by mouth every 6 (six) hours as needed. Reported on 02/20/2015    . cephALEXin (KEFLEX) 500 MG capsule Take 1 capsule (500 mg total) by mouth 4 (four) times daily. Take all of medicine and drink lots of fluids 28 capsule 0  . sulfamethoxazole-trimethoprim (BACTRIM DS) 800-160 MG per tablet Take 1 tablet by mouth 2 (two) times daily. 14 tablet 0   No facility-administered medications prior to visit.    Social History   Social History  . Marital Status: Married    Spouse Name: N/A  . Number of Children: N/A  . Years of Education: N/A   Social History Main Topics  . Smoking status: Never Smoker   . Smokeless tobacco: None  . Alcohol Use: No  . Drug Use: No  . Sexual Activity: Yes   Other Topics Concern  . None   Social History Narrative     Review of Systems  Constitutional: Positive for fever and  chills. Negative for appetite change.  HENT: Positive for congestion, postnasal drip, rhinorrhea and sinus pressure. Negative for ear pain and sore throat.   Eyes: Negative for pain and redness.  Respiratory: Positive for cough. Negative for shortness of breath and wheezing.   Cardiovascular: Negative for leg swelling.  Gastrointestinal: Negative for nausea, vomiting, abdominal pain, diarrhea, constipation and blood in stool.  Endocrine: Negative for polyuria.  Genitourinary: Negative for dysuria, urgency, frequency and flank pain.  Musculoskeletal: Negative for gait problem.  Skin: Negative for rash.  Neurological: Negative for weakness and headaches.  Psychiatric/Behavioral: Negative for confusion and decreased concentration. The patient is not nervous/anxious.     Objective:  BP 132/80 mmHg  Pulse 74  Temp(Src) 99.2 F (37.3 C) (Oral)  Resp 17  Ht 5\' 8"  (1.727 m)  Wt 234 lb (106.142 kg)  BMI 35.59 kg/m2  SpO2 97%  Physical Exam  Constitutional: He is oriented to person, place, and time. He appears well-developed and well-nourished. No distress.  HENT:  Head: Normocephalic and atraumatic.  Right Ear: External ear normal.  Left Ear: External ear normal.  Nose: Nose normal.  Eyes: Conjunctivae and EOM are normal. Pupils are equal, round, and reactive to light. No scleral icterus.  Neck: Normal range of motion. Neck supple. No tracheal deviation present.  Cardiovascular: Normal rate, regular rhythm and normal heart sounds.   Pulmonary/Chest: Effort normal. No  respiratory distress. He has no wheezes. He has no rales.  Abdominal: He exhibits no mass. There is no tenderness. There is no rebound and no guarding.  Musculoskeletal: He exhibits no edema.  Lymphadenopathy:    He has no cervical adenopathy.  Neurological: He is alert and oriented to person, place, and time. Coordination normal.  Skin: Skin is warm and dry. No rash noted.  Psychiatric: He has a normal mood and affect.  His behavior is normal.      Assessment & Plan:   Bruce Bailey was seen today for cough, uri and nasal congestion.  Diagnoses and all orders for this visit:  Acute pansinusitis, recurrence not specified  Acute bronchitis, unspecified organism  Other orders -     pseudoephedrine-guaifenesin (MUCINEX D) 60-600 MG 12 hr tablet; Take 1 tablet by mouth every 12 (twelve) hours. -     chlorpheniramine-HYDROcodone (TUSSIONEX PENNKINETIC ER) 10-8 MG/5ML SUER; Take 5 mLs by mouth 2 (two) times daily. -     amoxicillin-clavulanate (AUGMENTIN) 875-125 MG tablet; Take 1 tablet by mouth 2 (two) times daily.  I have discontinued Bruce Bailey's cephALEXin and sulfamethoxazole-trimethoprim. I am also having him start on pseudoephedrine-guaifenesin, chlorpheniramine-HYDROcodone, and amoxicillin-clavulanate. Additionally, I am having him maintain his ibuprofen.  Meds ordered this encounter  Medications  . pseudoephedrine-guaifenesin (MUCINEX D) 60-600 MG 12 hr tablet    Sig: Take 1 tablet by mouth every 12 (twelve) hours.    Dispense:  18 tablet    Refill:  0  . chlorpheniramine-HYDROcodone (TUSSIONEX PENNKINETIC ER) 10-8 MG/5ML SUER    Sig: Take 5 mLs by mouth 2 (two) times daily.    Dispense:  60 mL    Refill:  0  . amoxicillin-clavulanate (AUGMENTIN) 875-125 MG tablet    Sig: Take 1 tablet by mouth 2 (two) times daily.    Dispense:  20 tablet    Refill:  0    Appropriate red flag conditions were discussed with the patient as well as actions that should be taken.  Patient expressed his understanding.  Follow-up: Return if symptoms worsen or fail to improve.  Carmelina Dane, MD

## 2015-02-20 NOTE — Patient Instructions (Signed)

## 2015-12-17 ENCOUNTER — Ambulatory Visit (INDEPENDENT_AMBULATORY_CARE_PROVIDER_SITE_OTHER): Payer: 59 | Admitting: Physician Assistant

## 2015-12-17 VITALS — BP 126/84 | HR 88 | Temp 100.8°F | Resp 18 | Ht 68.0 in | Wt 239.0 lb

## 2015-12-17 DIAGNOSIS — R509 Fever, unspecified: Secondary | ICD-10-CM

## 2015-12-17 DIAGNOSIS — R590 Localized enlarged lymph nodes: Secondary | ICD-10-CM

## 2015-12-17 DIAGNOSIS — J029 Acute pharyngitis, unspecified: Secondary | ICD-10-CM

## 2015-12-17 LAB — POCT RAPID STREP A (OFFICE): Rapid Strep A Screen: NEGATIVE

## 2015-12-17 MED ORDER — PENICILLIN G BENZATHINE 1200000 UNIT/2ML IM SUSP
1.2000 10*6.[IU] | Freq: Once | INTRAMUSCULAR | Status: AC
  Administered 2015-12-17: 1.2 10*6.[IU] via INTRAMUSCULAR

## 2015-12-17 NOTE — Patient Instructions (Signed)
     IF you received an x-ray today, you will receive an invoice from [facility]. Please contact [facility] at [phone] with questions or concerns regarding your invoice.   IF you received labwork today, you will receive an invoice from [facility]. Please contact [facility] at [phone] with questions or concerns regarding your invoice.   Our billing staff will not be able to assist you with questions regarding bills from these companies.  You will be contacted with the lab results as soon as they are available. The fastest way to get your results is to activate your My Chart account. Instructions are located on the last page of this paperwork. If you have not heard from us regarding the results in 2 weeks, please contact this office.      

## 2015-12-17 NOTE — Progress Notes (Signed)
12/17/2015 1:50 PM   DOB: 07-29-1975 / MRN: [id number]  SUBJECTIVE:  Bruce Bailey is a 40 y.o. male presenting for sore throat that started 2 days ago.  Bruce Bailey associates fever, chills, fatigue. Bruce Bailey denies cough, myalgia, nasal congestion.  Feels that Bruce Bailey is getting worse.  Bruce Bailey has not tried anything for Bruce Bailey symptoms. Bruce Bailey denies a history of medication allergies.   Bruce Bailey has No Known Allergies.   Bruce Bailey  has no past medical history on file.    Bruce Bailey  reports that Bruce Bailey has never smoked. Bruce Bailey has never used smokeless tobacco. Bruce Bailey reports that Bruce Bailey does not drink alcohol or use drugs. Bruce Bailey  reports that Bruce Bailey currently engages in sexual activity. The patient  has a past surgical history that includes Other surgical history (Bilateral) and laparoscopic appendectomy (N/A, 06/21/2012).  Bruce Bailey family history is not on file.  Review of Systems  Constitutional: Positive for chills, fever and malaise/fatigue.  Respiratory: Negative for cough and shortness of breath.   Cardiovascular: Negative for chest pain.  Gastrointestinal: Negative for nausea.  Skin: Negative for itching and rash.  Neurological: Negative for loss of consciousness and headaches.    The problem list and medications were reviewed and updated by myself where necessary and exist elsewhere in the encounter.   OBJECTIVE:  BP 126/84 (BP Location: Right Arm, Patient Position: Sitting, Cuff Size: Large)   Pulse 88   Temp (!) 100.8 F (38.2 C) (Oral)   Resp 18   Ht 5\' 8"  (1.727 m)   Wt 239 lb (108.4 kg)   SpO2 96%   BMI 36.34 kg/m   Physical Exam  Constitutional: Bruce Bailey is oriented to person, place, and time.  HENT:  Mouth/Throat: Uvula is midline and mucous membranes are normal. No uvula swelling. Posterior oropharyngeal erythema present. No oropharyngeal exudate or tonsillar abscesses.  Cardiovascular: Normal rate, regular rhythm and normal heart sounds.   Pulmonary/Chest: Effort normal and breath sounds normal.  Musculoskeletal: Normal range of motion.    Lymphadenopathy:       Head (right side): Submandibular and tonsillar adenopathy present. No submental and no preauricular adenopathy present.       Head (left side): Submandibular and tonsillar adenopathy present. No submental and no preauricular adenopathy present.  Neurological: Bruce Bailey is alert and oriented to person, place, and time.  Vitals reviewed.   No results found for this or any previous visit (from the past 72 hour(s)).  No results found.  ASSESSMENT AND PLAN  Bruce Bailey was seen today for sore throat and fever.  Diagnoses and all orders for this visit:  Sore throat: Question flu however Bruce Bailey is not complaining of cough and myalgia.  Will go ahead and cover for a bacterial pharyngitis.  Bruce Bailey is requesting an injection.  Medication selected below per [facility] Guide.  -     penicillin g benzathine (BICILLIN LA) 1200000 UNIT/2ML injection 1.2 Million Units; Inject 2 mLs (1.2 Million Units total) into the muscle once. -     POCT rapid strep A -     Culture, Group A Strep  Fever and chills -     penicillin g benzathine (BICILLIN LA) 1200000 UNIT/2ML injection 1.2 Million Units; Inject 2 mLs (1.2 Million Units total) into the muscle once.  Cervical lymphadenopathy -     penicillin g benzathine (BICILLIN LA) 1200000 UNIT/2ML injection 1.2 Million Units; Inject 2 mLs (1.2 Million Units total) into the muscle once.    The patient is advised to call or return to clinic if  Bruce Bailey does not see an improvement in symptoms, or to seek the care of the closest emergency department if Bruce Bailey worsens with the above plan.   Deliah BostonMichael Clotee Schlicker, MHS, PA-C [facility] 12/17/2015 1:50 PM

## 2015-12-20 LAB — CULTURE, GROUP A STREP

## 2015-12-24 ENCOUNTER — Ambulatory Visit (INDEPENDENT_AMBULATORY_CARE_PROVIDER_SITE_OTHER): Payer: 59 | Admitting: Physician Assistant

## 2015-12-24 VITALS — BP 118/82 | HR 59 | Temp 98.4°F | Resp 17 | Ht 68.0 in | Wt 241.0 lb

## 2015-12-24 DIAGNOSIS — R51 Headache: Secondary | ICD-10-CM

## 2015-12-24 DIAGNOSIS — R519 Headache, unspecified: Secondary | ICD-10-CM

## 2015-12-24 MED ORDER — TRAMADOL HCL 50 MG PO TABS: 50.0000 mg | ORAL_TABLET | Freq: Three times a day (TID) | ORAL | 0 refills | Status: DC | PRN

## 2015-12-24 MED ORDER — IBUPROFEN 800 MG PO TABS: 800.0000 mg | ORAL_TABLET | Freq: Three times a day (TID) | ORAL | 0 refills | Status: DC | PRN

## 2015-12-24 MED ORDER — CYCLOBENZAPRINE HCL 10 MG PO TABS: 10.0000 mg | ORAL_TABLET | Freq: Three times a day (TID) | ORAL | 0 refills | Status: DC | PRN

## 2015-12-24 NOTE — Patient Instructions (Addendum)
Migraine Headache A migraine headache is an intense, throbbing pain on one or both sides of your head. A migraine can last for 30 minutes to several hours. CAUSES  The exact cause of a migraine headache is not always known. However, a migraine may be caused when nerves in the brain become irritated and release chemicals that cause inflammation. This causes pain. Certain things may also trigger migraines, such as:  Alcohol.  Smoking.  Stress.  Menstruation.  Aged cheeses.  Foods or drinks that contain nitrates, glutamate, aspartame, or tyramine.  Lack of sleep.  Chocolate.  Caffeine.  Hunger.  Physical exertion.  Fatigue.  Medicines used to treat chest pain (nitroglycerine), birth control pills, estrogen, and some blood pressure medicines. SIGNS AND SYMPTOMS  Pain on one or both sides of your head.  Pulsating or throbbing pain.  Severe pain that prevents daily activities.  Pain that is aggravated by any physical activity.  Nausea, vomiting, or both.  Dizziness.  Pain with exposure to bright lights, loud noises, or activity.  General sensitivity to bright lights, loud noises, or smells. Before you get a migraine, you may get warning signs that a migraine is coming (aura). An aura may include:  Seeing flashing lights.  Seeing bright spots, halos, or zigzag lines.  Having tunnel vision or blurred vision.  Having feelings of numbness or tingling.  Having trouble talking.  Having muscle weakness. DIAGNOSIS  A migraine headache is often diagnosed based on:  Symptoms.  Physical exam.  A CT scan or MRI of your head. These imaging tests cannot diagnose migraines, but they can help rule out other causes of headaches. TREATMENT Medicines may be given for pain and nausea. Medicines can also be given to help prevent recurrent migraines.  HOME CARE INSTRUCTIONS  Only take over-the-counter or prescription medicines for pain or discomfort as directed by your  health care provider. The use of long-term narcotics is not recommended.  Lie down in a dark, quiet room when you have a migraine.  Keep a journal to find out what may trigger your migraine headaches. For example, write down:  What you eat and drink.  How much sleep you get.  Any change to your diet or medicines.  Limit alcohol consumption.  Quit smoking if you smoke.  Get 7-9 hours of sleep, or as recommended by your health care provider.  Limit stress.  Keep lights dim if bright lights bother you and make your migraines worse. SEEK IMMEDIATE MEDICAL CARE IF:   Your migraine becomes severe.  You have a fever.  You have a stiff neck.  You have vision loss.  You have muscular weakness or loss of muscle control.  You start losing your balance or have trouble walking.  You feel faint or pass out.  You have severe symptoms that are different from your first symptoms. MAKE SURE YOU:   Understand these instructions.  Will watch your condition.  Will get help right away if you are not doing well or get worse.   This information is not intended to replace advice given to you by your health care provider. Make sure you discuss any questions you have with your health care provider.   Document Released: 02/14/2005 Document Revised: 03/07/2014 Document Reviewed: 10/22/2012 Elsevier Interactive Patient Education 2016 [employer]     IF you received an x-ray today, you will receive an invoice from [facility]. Please contact [facility] at [phone] with questions or concerns regarding your invoice.   IF you   received labwork today, you will receive an invoice from [facility]. Please contact [facility] at [phone] with questions or concerns regarding your invoice.   Our billing staff will not be able to assist you with questions regarding bills from these companies.  You will be contacted with the lab results as soon as  they are available. The fastest way to get your results is to activate your My Chart account. Instructions are located on the last page of this paperwork. If you have not heard from us regarding the results in 2 weeks, please contact this office.     

## 2015-12-24 NOTE — Progress Notes (Signed)
   Bruce GamblerRicky Bailey  MRN: [id number] DOB: 05/13/1975  Subjective:  Pt presents to clinic with headache for the last 4 days - it is located right parietal lobe - it has not changed location since it started - it is better with Advil migraines - throbbing headache - at its worst 5/10 - no nausea, no photophobia, no change in lifestyle - no recent stress - no injury to the head - he has also noticed that when he pushes on his occiput he gets relief from his headache - he has not had neck pain or spasm but he does have a right shoulder injury that has been acting up lately  No history of migraines - he took advil migraine and that helped last night and this morning  Review of Systems  Eyes: Negative for photophobia.  Gastrointestinal: Negative for nausea.  Neurological: Positive for headaches. Negative for dizziness and weakness.    There are no active problems to display for this patient.   No current outpatient prescriptions on file prior to visit.   No current facility-administered medications on file prior to visit.     No Known Allergies  Pt patients past, family and social history were reviewed and updated.  Objective:  BP 118/82 (BP Location: Left Arm, Patient Position: Sitting, Cuff Size: Large)   Pulse (!) 59   Temp 98.4 F (36.9 C) (Oral)   Resp 17   Ht 5\' 8"  (1.727 m)   Wt 241 lb (109.3 kg)   SpO2 99%   BMI 36.64 kg/m   Physical Exam  Constitutional: He is oriented to person, place, and time and well-developed, well-nourished, and in no distress.  HENT:  Head: Normocephalic and atraumatic.    Right Ear: External ear normal.  Left Ear: External ear normal.  Eyes: Conjunctivae and EOM are normal. Pupils are equal, round, and reactive to light.  Neck: Normal range of motion.  Cardiovascular: Normal rate, regular rhythm and normal heart sounds.   No murmur heard. Pulmonary/Chest: Effort normal and breath sounds normal. He has no wheezes.  Neurological: He is alert  and oriented to person, place, and time. He has normal sensation, normal strength, normal reflexes and intact cranial nerves. He displays no weakness and normal reflexes. Gait normal. Gait normal.  Skin: Skin is warm and dry.  Psychiatric: Mood, memory, affect and judgment normal.    Assessment and Plan :  Acute nonintractable headache, unspecified headache type - Plan: cyclobenzaprine (FLEXERIL) 10 MG tablet, ibuprofen (ADVIL,MOTRIN) 800 MG tablet, traMADol (ULTRAM) 50 MG tablet  Seem like a tension style migraine - he has a pressure point in the occiput area - will start muscle relaxers and NSAIDs and pain medication as needed - this may take a few days to completely resolve because I suspect there is a muscle aspect of his headache - warning signs were discussed and he knows when to seek further medical care  Benny LennertSarah Glenda Spelman PA-C  [facility] 12/24/2015 6:00 PM

## 2015-12-25 ENCOUNTER — Encounter: Payer: Self-pay | Admitting: Emergency Medicine

## 2016-02-07 ENCOUNTER — Ambulatory Visit (INDEPENDENT_AMBULATORY_CARE_PROVIDER_SITE_OTHER): Payer: 59

## 2016-02-07 ENCOUNTER — Ambulatory Visit (HOSPITAL_COMMUNITY)
Admission: EM | Admit: 2016-02-07 | Discharge: 2016-02-07 | Disposition: A | Discharge status: Home / Self Care | Payer: 59 | Attending: Emergency Medicine | Admitting: Emergency Medicine

## 2016-02-07 ENCOUNTER — Encounter (HOSPITAL_COMMUNITY): Payer: Self-pay | Admitting: Emergency Medicine

## 2016-02-07 DIAGNOSIS — J181 Lobar pneumonia, unspecified organism: Secondary | ICD-10-CM

## 2016-02-07 DIAGNOSIS — J189 Pneumonia, unspecified organism: Secondary | ICD-10-CM

## 2016-02-07 MED ORDER — LEVOFLOXACIN 500 MG PO TABS: 500.0000 mg | ORAL_TABLET | Freq: Every day | ORAL | 0 refills | Status: DC

## 2016-02-07 MED ORDER — PREDNISONE 50 MG PO TABS: ORAL_TABLET | ORAL | 0 refills | Status: DC

## 2016-02-07 MED ORDER — BENZONATATE 100 MG PO CAPS: 100.0000 mg | ORAL_CAPSULE | Freq: Three times a day (TID) | ORAL | 0 refills | Status: DC

## 2016-02-07 NOTE — Discharge Instructions (Signed)
Your x-ray is concerning for a pneumonia. Take Levaquin daily for 7 days. Take prednisone daily for 5 days. Do not take this on an empty stomach. Use the Tessalon 3 times a day as needed for cough. Give the antibiotics 48 hours, and then you can get back to your usual activities. The cough can take up to 6 weeks to fully resolve. Follow-up as needed.

## 2016-02-07 NOTE — ED Provider Notes (Signed)
[facility]    CSN: [id number] Arrival date & time: 02/07/16  1335     History   Chief Complaint Chief Complaint  Patient presents with  . Cough    HPI Bruce Bailey is a 40 y.o. male.   HPI He is a 40 year old man here for evaluation of cough. He reports a cough for the last month. It is associated with congestion and sinus drainage. He reports intermittent fevers, last known fever was Friday. When this started, he was seen at an urgent care and treated with a shot and some antibiotics. They told him he had strep. The medication will help some, but then symptoms recur. Describes feeling short of breath, in that he can't take a deep breath without triggering a coughing spasm. He started feeling worse again on Friday with coughing, nausea, and vomiting. He has been taking Mucinex every 4 hours for 36 hours and drinking lots of orange juice. He states he feel dehydrated.   History reviewed. No pertinent past medical history.  There are no active problems to display for this patient.   Past Surgical History:  Procedure Laterality Date  . LAPAROSCOPIC APPENDECTOMY N/A 06/21/2012   Procedure: APPENDECTOMY LAPAROSCOPIC;  Surgeon: Robyne AskewPaul S Toth III, MD;  Location: [facility] OR;  Service: General;  Laterality: N/A;  . OTHER SURGICAL HISTORY Bilateral    ACL times 3       Home Medications    Prior to Admission medications   Medication Sig Start Date End Date Taking? Authorizing Provider  acetaminophen (TYLENOL) 325 MG tablet Take 650 mg by mouth every 6 (six) hours as needed.    Historical Provider, MD  Aspirin-Acetaminophen-Caffeine (GOODY HEADACHE PO) Take by mouth.    Historical Provider, MD  benzonatate (TESSALON) 100 MG capsule Take 1 capsule (100 mg total) by mouth every 8 (eight) hours. 02/07/16   Charm RingsErin J Zamiah Tollett, MD  cyclobenzaprine (FLEXERIL) 10 MG tablet Take 1 tablet (10 mg total) by mouth 3 (three) times daily as needed for muscle spasms. 12/24/15   Morrell RiddleSarah L Weber, PA-C    Ibuprofen (ADVIL MIGRAINE) 200 MG CAPS Take by mouth.    Historical Provider, MD  ibuprofen (ADVIL,MOTRIN) 800 MG tablet Take 1 tablet (800 mg total) by mouth every 8 (eight) hours as needed for headache. 12/24/15   Morrell RiddleSarah L Weber, PA-C  levofloxacin (LEVAQUIN) 500 MG tablet Take 1 tablet (500 mg total) by mouth daily. 02/07/16   Charm RingsErin J Drey Shaff, MD  predniSONE (DELTASONE) 50 MG tablet Take 1 pill daily for 5 days. 02/07/16   Charm RingsErin J Ellison Leisure, MD  traMADol (ULTRAM) 50 MG tablet Take 1 tablet (50 mg total) by mouth every 8 (eight) hours as needed for severe pain. 12/24/15   Morrell RiddleSarah L Weber, PA-C    Family History History reviewed. No pertinent family history.  Social History Social History  Substance Use Topics  . Smoking status: Never Smoker  . Smokeless tobacco: Never Used  . Alcohol use No     Allergies   Patient has no known allergies.   Review of Systems Review of Systems As in history of present illness  Physical Exam Triage Vital Signs ED Triage Vitals  Enc Vitals Group     BP 02/07/16 1356 140/77     Pulse Rate 02/07/16 1356 85     Resp 02/07/16 1356 16     Temp 02/07/16 1356 99.3 F (37.4 C)     Temp Source 02/07/16 1356 Oral     SpO2 02/07/16  1356 100 %     Weight --      Height --      Head Circumference --      Peak Flow --      Pain Score 02/07/16 1401 0     Pain Loc --      Pain Edu? --      Excl. in GC? --    No data found.   Updated Vital Signs BP 140/77 (BP Location: Left Arm)   Pulse 85   Temp 99.3 F (37.4 C) (Oral)   Resp 16   SpO2 100%   Visual Acuity Right Eye Distance:   Left Eye Distance:   Bilateral Distance:    Right Eye Near:   Left Eye Near:    Bilateral Near:     Physical Exam  Constitutional: He is oriented to person, place, and time. He appears well-developed and well-nourished. No distress.  HENT:  Mouth/Throat: No oropharyngeal exudate.  Irritation to the posterior oropharynx. No sinus tenderness.  Neck: Neck supple.   Cardiovascular: Normal rate, regular rhythm and normal heart sounds.   No murmur heard. Pulmonary/Chest: Effort normal. No respiratory distress. He has no wheezes. He has no rales.  Faint wheeze with coughing  Lymphadenopathy:    He has no cervical adenopathy.  Neurological: He is alert and oriented to person, place, and time.     UC Treatments / Results  Labs (all labs ordered are listed, but only abnormal results are displayed) Labs Reviewed - No data to display  EKG  EKG Interpretation None       Radiology Dg Chest 2 View  Result Date: 02/07/2016 CLINICAL DATA:  Cough and congestion with low-grade fever, nausea, vomiting and diarrhea 2 weeks. EXAM: CHEST  2 VIEW COMPARISON:  None. FINDINGS: Lungs are hypoinflated with mild left retrocardiac opacification which may be due to vascular crowding versus atelectasis or infection. Cardiomediastinal silhouette and remainder of the exam is unremarkable. IMPRESSION: Mild left retrocardiac opacification which may be due to vascular crowding, atelectasis or infection. Electronically Signed   By: Elberta Fortisaniel  Boyle M.D.   On: 02/07/2016 14:53    Procedures Procedures (including critical care time)  Medications Ordered in UC Medications - No data to display   Initial Impression / Assessment and Plan / UC Course  I have reviewed the triage vital signs and the nursing notes.  Pertinent labs & imaging results that were available during my care of the patient were reviewed by me and considered in my medical decision making (see chart for details).  Clinical Course     Treatment with Levaquin and prednisone. Tessalon as needed for cough. Discussed that the cough may linger 4-6 weeks. Follow-up as needed.  Final Clinical Impressions(s) / UC Diagnoses   Final diagnoses:  Community acquired pneumonia of left lower lobe of lung (HCC)    New Prescriptions New Prescriptions   BENZONATATE (TESSALON) 100 MG CAPSULE    Take 1 capsule  (100 mg total) by mouth every 8 (eight) hours.   LEVOFLOXACIN (LEVAQUIN) 500 MG TABLET    Take 1 tablet (500 mg total) by mouth daily.   PREDNISONE (DELTASONE) 50 MG TABLET    Take 1 pill daily for 5 days.     Charm RingsErin J Camren Lipsett, MD 02/07/16 [phone]

## 2016-02-07 NOTE — ED Triage Notes (Signed)
[facility] a [facility] 1 month. The patient reported that he has had a cough x 1 month and [facility] to include being diagnosed with Strep and prescribed an oral antibiotic and a "shot." The patient stated that the symptoms return after the medication is completed.

## 2016-04-19 ENCOUNTER — Encounter (HOSPITAL_COMMUNITY): Payer: Self-pay | Admitting: Emergency Medicine

## 2016-04-19 DIAGNOSIS — R51 Headache: Secondary | ICD-10-CM | POA: Diagnosis not present

## 2016-04-19 DIAGNOSIS — R509 Fever, unspecified: Secondary | ICD-10-CM | POA: Diagnosis not present

## 2016-04-19 DIAGNOSIS — Z7982 Long term (current) use of aspirin: Secondary | ICD-10-CM | POA: Insufficient documentation

## 2016-04-19 DIAGNOSIS — R05 Cough: Secondary | ICD-10-CM | POA: Diagnosis not present

## 2016-04-19 DIAGNOSIS — M791 Myalgia: Secondary | ICD-10-CM | POA: Diagnosis not present

## 2016-04-19 NOTE — ED Triage Notes (Signed)
Pt states he has headache, body aches, sweats, fever, and cough  Pt states symptoms started yesterday  Pt states he has been taking alka seltzer cold and flu  Last dose was at 2115 tonight

## 2016-04-20 ENCOUNTER — Emergency Department (HOSPITAL_COMMUNITY)
Admission: EM | Admit: 2016-04-20 | Discharge: 2016-04-20 | Disposition: A | Discharge status: Home / Self Care | Payer: 59 | Attending: Emergency Medicine | Admitting: Emergency Medicine

## 2016-04-20 ENCOUNTER — Emergency Department (HOSPITAL_COMMUNITY): Payer: 59

## 2016-04-20 DIAGNOSIS — R6889 Other general symptoms and signs: Secondary | ICD-10-CM

## 2016-04-20 DIAGNOSIS — R05 Cough: Secondary | ICD-10-CM | POA: Diagnosis not present

## 2016-04-20 MED ORDER — KETOROLAC TROMETHAMINE 30 MG/ML IJ SOLN
30.0000 mg | Freq: Once | INTRAMUSCULAR | Status: AC
  Administered 2016-04-20: 30 mg via INTRAMUSCULAR
  Filled 2016-04-20: qty 1

## 2016-04-20 MED ORDER — OSELTAMIVIR PHOSPHATE 75 MG PO CAPS: 75.0000 mg | ORAL_CAPSULE | Freq: Two times a day (BID) | ORAL | 0 refills | Status: DC

## 2016-04-20 MED ORDER — IBUPROFEN 600 MG PO TABS: 600.0000 mg | ORAL_TABLET | Freq: Four times a day (QID) | ORAL | 0 refills | Status: DC | PRN

## 2016-04-20 MED ORDER — ACETAMINOPHEN 325 MG PO TABS
650.0000 mg | ORAL_TABLET | Freq: Once | ORAL | Status: AC | PRN
  Administered 2016-04-20: 650 mg via ORAL
  Filled 2016-04-20: qty 2

## 2016-04-20 NOTE — Discharge Instructions (Signed)
Please read and follow all provided instructions.  Your diagnoses today include:  1. Flu-like symptoms    Tests performed today include: Chest Xray Vital signs. See below for your results today.   Medications prescribed:   Take any prescribed medications only as directed.  Home care instructions:  Follow any educational materials contained in this packet. Please continue drinking plenty of fluids. Use over-the-counter cold and flu medications as needed as directed on packaging for symptom relief. You may also use ibuprofen or tylenol as directed on packaging for pain or fever.   BE VERY CAREFUL not to take multiple medicines containing Tylenol (also called acetaminophen). Doing so can lead to an overdose which can damage your liver and cause liver failure and possibly death.   Follow-up instructions: Please follow-up with your primary care provider in the next 3 days for further evaluation of your symptoms.   Return instructions:  Please return to the Emergency Department if you experience worsening symptoms. Please return if you have a high fever greater than 101 degrees not controlled with over-the-counter medications, persistent vomiting and cannot keep down fluids, or worsening trouble breathing. Please return if you have any other emergent concerns.  Additional Information:  Your vital signs today were: BP 123/61 (BP Location: Right Arm)    Pulse 73    Temp 102 F (38.9 C) (Oral)    Resp 18    Ht 5\' 8"  (1.727 m)    Wt 109.1 kg    SpO2 95%    BMI 36.57 kg/m  If your blood pressure (BP) was elevated above 135/85 this visit, please have this repeated by your doctor within one month.

## 2016-04-20 NOTE — ED Provider Notes (Signed)
[facility]-EMERGENCY DEPT Provider Note   CSN: [id number] Arrival date & time: 04/19/16  2253  By signing my name below, I, Modena Jansky, attest that this documentation has been prepared under the direction and in the presence of non-physician practitioner, Audry Pili, PA-C. Electronically Signed: Modena Jansky, Scribe. 04/20/2016. 1:52 AM.  History   Chief Complaint Chief Complaint  Patient presents with  . Fever  . Cough   The history is provided by the patient. No language interpreter was used.   HPI Comments: Bruce Bailey is a 40 y.o. male who presents to the Emergency Department complaining of intermittent moderate cough that started yesterday. He states he has been having gradually worsening URI-like symptoms. He reports associated symptoms of headache, myalgias, fever (Tmax: 103). Pt's temperature in the ED today was 102.8. He denies any sick contacts, sore throat, rhinorrhea, nasal congestion, or other complaints.   History reviewed. No pertinent past medical history.  There are no active problems to display for this patient.   Past Surgical History:  Procedure Laterality Date  . knee surgery x 3     . LAPAROSCOPIC APPENDECTOMY N/A 06/21/2012   Procedure: APPENDECTOMY LAPAROSCOPIC;  Surgeon: Robyne Askew, MD;  Location: [facility] OR;  Service: General;  Laterality: N/A;  . OTHER SURGICAL HISTORY Bilateral    ACL times 3       Home Medications    Prior to Admission medications   Medication Sig Start Date End Date Taking? Authorizing Provider  acetaminophen (TYLENOL) 325 MG tablet Take 650 mg by mouth every 6 (six) hours as needed.    Historical Provider, MD  Aspirin-Acetaminophen-Caffeine (GOODY HEADACHE PO) Take by mouth.    Historical Provider, MD  benzonatate (TESSALON) 100 MG capsule Take 1 capsule (100 mg total) by mouth every 8 (eight) hours. 02/07/16   Charm Rings, MD  cyclobenzaprine (FLEXERIL) 10 MG tablet Take 1 tablet (10 mg total) by mouth 3 (three) times daily as  needed for muscle spasms. 12/24/15   Morrell Riddle, PA-C  Ibuprofen (ADVIL MIGRAINE) 200 MG CAPS Take by mouth.    Historical Provider, MD  ibuprofen (ADVIL,MOTRIN) 800 MG tablet Take 1 tablet (800 mg total) by mouth every 8 (eight) hours as needed for headache. 12/24/15   Morrell Riddle, PA-C  levofloxacin (LEVAQUIN) 500 MG tablet Take 1 tablet (500 mg total) by mouth daily. 02/07/16   Charm Rings, MD  predniSONE (DELTASONE) 50 MG tablet Take 1 pill daily for 5 days. 02/07/16   Charm Rings, MD  traMADol (ULTRAM) 50 MG tablet Take 1 tablet (50 mg total) by mouth every 8 (eight) hours as needed for severe pain. 12/24/15   Morrell Riddle, PA-C    Family History History reviewed. No pertinent family history.  Social History Social History  Substance Use Topics  . Smoking status: Never Smoker  . Smokeless tobacco: Never Used  . Alcohol use No     Allergies   Patient has no known allergies.   Review of Systems Review of Systems  Constitutional: Positive for fever (Tmax: 103).  HENT: Negative for congestion, rhinorrhea and sore throat.   Respiratory: Positive for cough.   Musculoskeletal: Positive for myalgias (Generalized).  Neurological: Positive for headaches.   Physical Exam Updated Vital Signs BP 133/78 (BP Location: Right Arm)   Pulse 76   Temp 102.8 F (39.3 C) (Oral)   Resp 18   Ht 5\' 8"  (1.727 m)   Wt 240 lb 8 oz (109.1 kg)  SpO2 95%   BMI 36.57 kg/m   Physical Exam  Constitutional: He is oriented to person, place, and time. He appears well-developed and well-nourished. No distress.  HENT:  Head: Normocephalic and atraumatic.  Right Ear: Tympanic membrane, external ear and ear canal normal.  Left Ear: Tympanic membrane, external ear and ear canal normal.  Nose: Nose normal.  Mouth/Throat: Uvula is midline, oropharynx is clear and moist and mucous membranes are normal. No trismus in the jaw. No oropharyngeal exudate, posterior oropharyngeal erythema or tonsillar  abscesses.  Eyes: Conjunctivae and EOM are normal. Pupils are equal, round, and reactive to light.  Neck: Normal range of motion. Neck supple. No tracheal deviation present.  Cardiovascular: Normal rate, regular rhythm, S1 normal, S2 normal, normal heart sounds, intact distal pulses and normal pulses.   Pulmonary/Chest: Effort normal and breath sounds normal. No respiratory distress. He has no decreased breath sounds. He has no wheezes. He has no rhonchi. He has no rales.  Abdominal: Soft. Normal appearance and bowel sounds are normal. There is no tenderness.  Musculoskeletal: Normal range of motion.  Neurological: He is alert and oriented to person, place, and time.  Skin: Skin is warm and dry.  Psychiatric: He has a normal mood and affect. His speech is normal and behavior is normal. Thought content normal.  Nursing note and vitals reviewed.  ED Treatments / Results  DIAGNOSTIC STUDIES: Oxygen Saturation is 95% on RA, normal by my interpretation.    COORDINATION OF CARE: 1:56 AM- Pt advised of plan for treatment and pt agrees.  Labs (all labs ordered are listed, but only abnormal results are displayed) Labs Reviewed - No data to display  EKG  EKG Interpretation None       Radiology Dg Chest 2 View  Result Date: 04/20/2016 CLINICAL DATA:  Cough and fever EXAM: CHEST  2 VIEW COMPARISON:  Chest radiograph 02/07/2016 FINDINGS: Shallow lung inflation. The lungs are clear. Cardiomediastinal contours are normal. No pleural effusion or pneumothorax. IMPRESSION: Shallow lung inflation without focal airspace disease. Electronically Signed   By: Deatra RobinsonKevin  Herman M.D.   On: 04/20/2016 03:04    Procedures Procedures (including critical care time)  Medications Ordered in ED Medications  acetaminophen (TYLENOL) tablet 650 mg (650 mg Oral Given 04/20/16 0158)  ketorolac (TORADOL) 30 MG/ML injection 30 mg (30 mg Intramuscular Given 04/20/16 0317)   Initial Impression / Assessment and Plan /  ED Course  I have reviewed the triage vital signs and the nursing notes.  Pertinent labs & imaging results that were available during my care of the patient were reviewed by me and considered in my medical decision making (see chart for details).  Final Clinical Impressions(s) / ED Diagnoses   {I have reviewed and evaluated the relevant imaging studies.  {I have reviewed the relevant previous healthcare records.  {I obtained HPI from historian.   ED Course:  Assessment: Patient with symptoms consistent with influenza.  Vitals are stable, low-grade fever.  No signs of dehydration, tolerating PO's.  Lungs are clear. CXR unremarkable. Discussed the cost versus benefit of Tamiflu treatment with the patient. Given Rx Tamiflu. Temp improved in ED. Patient will be discharged with instructions to orally hydrate, rest, and use over-the-counter medications such as anti-inflammatories ibuprofen and Aleve for muscle aches and Tylenol for fever.  Patient will also be given a cough suppressant.   Disposition/Plan:  DC Home Additional Verbal discharge instructions given and discussed with patient.  Pt Instructed to f/u with PCP in the  next week for evaluation and treatment of symptoms. Return precautions given Pt acknowledges and agrees with plan  Supervising Physician April Palumbo, MD  Final diagnoses:  Flu-like symptoms    New Prescriptions New Prescriptions   No medications on file   I personally performed the services described in this documentation, which was scribed in my presence. The recorded information has been reviewed and is accurate.    Audry Pili, PA-C 04/20/16 0330    Cy Blamer, MD 04/20/16 [phone]

## 2016-04-20 NOTE — ED Notes (Signed)
Pt reports body aches that started yesterday along with cough. Pt reporting chills and fever. Alka seltzer cold and flu taken for symptoms at 2115 yesterday.

## 2016-04-20 NOTE — ED Notes (Signed)
Pt reports understanding of discharge information. No questions at time of discharge 

## 2016-04-21 ENCOUNTER — Encounter (HOSPITAL_COMMUNITY): Payer: Self-pay | Admitting: Emergency Medicine

## 2016-04-21 ENCOUNTER — Ambulatory Visit (HOSPITAL_COMMUNITY)
Admission: EM | Admit: 2016-04-21 | Discharge: 2016-04-21 | Disposition: A | Discharge status: Home / Self Care | Payer: 59 | Attending: Family Medicine | Admitting: Family Medicine

## 2016-04-21 DIAGNOSIS — J111 Influenza due to unidentified influenza virus with other respiratory manifestations: Secondary | ICD-10-CM

## 2016-04-21 MED ORDER — ACETAMINOPHEN 325 MG PO TABS
ORAL_TABLET | ORAL | Status: AC
  Filled 2016-04-21: qty 2

## 2016-04-21 MED ORDER — INDOMETHACIN 50 MG PO CAPS: 50.0000 mg | ORAL_CAPSULE | Freq: Two times a day (BID) | ORAL | 0 refills | Status: DC

## 2016-04-21 MED ORDER — ACETAMINOPHEN 325 MG PO TABS
650.0000 mg | ORAL_TABLET | Freq: Once | ORAL | Status: AC
  Administered 2016-04-21: 650 mg via ORAL

## 2016-04-21 NOTE — Discharge Instructions (Signed)
Continue with same instructions given to you by the emergency department yesterday. Do not take the ibuprofen, may take the indomethacin instead. He may also take the tramadol every 4 hours rather than every 8 hours. Be sure to drink plenty of fluids and stay well-hydrated. He may take Tylenol every 4 hours as needed.

## 2016-04-21 NOTE — ED Provider Notes (Signed)
CSN: [id number]     Arrival date & time 04/21/16  1739 History   None    Chief Complaint  Patient presents with  . URI   (Consider location/radiation/quality/duration/timing/severity/associated sxs/prior Treatment) 41 year old male presents with flu like symptoms. He was seen yesterday in the emergency department and had a normal chest x-ray essentially normal physical exam and treated with Tamiflu ibuprofen and the usual list of OTC medications for his symptoms. He is also taking tramadol 50 mg every 8 hours as needed. His chief complaint is headache. He denies any associated neurologic complaints but the headache persists. He has been sick for 4 days. He did not receive a flu shot this year      History reviewed. No pertinent past medical history. Past Surgical History:  Procedure Laterality Date  . knee surgery x 3     . LAPAROSCOPIC APPENDECTOMY N/A 06/21/2012   Procedure: APPENDECTOMY LAPAROSCOPIC;  Surgeon: Robyne AskewPaul S Toth III, MD;  Location: [facility] OR;  Service: General;  Laterality: N/A;  . OTHER SURGICAL HISTORY Bilateral    ACL times 3   History reviewed. No pertinent family history. Social History  Substance Use Topics  . Smoking status: Never Smoker  . Smokeless tobacco: Never Used  . Alcohol use No    Review of Systems  Constitutional: Positive for activity change, appetite change, fatigue and fever.  HENT: Positive for congestion.   Respiratory: Negative.   Cardiovascular: Negative for chest pain.  Gastrointestinal: Negative.   Musculoskeletal: Positive for myalgias.  Skin: Negative for rash.  Neurological: Positive for headaches. Negative for tremors, syncope, facial asymmetry and speech difficulty.  All other systems reviewed and are negative.   Allergies  Patient has no known allergies.  Home Medications   Prior to Admission medications   Medication Sig Start Date End Date Taking? Authorizing Provider  cyclobenzaprine (FLEXERIL) 10 MG tablet Take 1 tablet (10  mg total) by mouth 3 (three) times daily as needed for muscle spasms. 12/24/15  Yes Morrell RiddleSarah L Weber, PA-C  oseltamivir (TAMIFLU) 75 MG capsule Take 1 capsule (75 mg total) by mouth every 12 (twelve) hours. 04/20/16  Yes Audry Piliyler Mohr, PA-C  acetaminophen (TYLENOL) 325 MG tablet Take 650 mg by mouth every 6 (six) hours as needed.    Historical Provider, MD  Aspirin-Acetaminophen-Caffeine (GOODY HEADACHE PO) Take by mouth.    Historical Provider, MD  benzonatate (TESSALON) 100 MG capsule Take 1 capsule (100 mg total) by mouth every 8 (eight) hours. 02/07/16   Charm RingsErin J Honig, MD  indomethacin (INDOCIN) 50 MG capsule Take 1 capsule (50 mg total) by mouth 2 (two) times daily with a meal. 04/21/16   Hayden Rasmussenavid Dakiya Puopolo, NP  predniSONE (DELTASONE) 50 MG tablet Take 1 pill daily for 5 days. 02/07/16   Charm RingsErin J Honig, MD  traMADol (ULTRAM) 50 MG tablet Take 1 tablet (50 mg total) by mouth every 8 (eight) hours as needed for severe pain. 12/24/15   Morrell RiddleSarah L Weber, PA-C   Meds Ordered and Administered this Visit   Medications  acetaminophen (TYLENOL) tablet 650 mg (not administered)    BP 132/66 (BP Location: Right Arm)   Pulse 68   Temp 100.6 F (38.1 C) (Oral)   Resp 16   SpO2 97%  No data found.   Physical Exam  Constitutional: He is oriented to person, place, and time. He appears well-developed and well-nourished. No distress.  HENT:  Head: Normocephalic and atraumatic.  Oropharynx with minor erythema and scant clear PND otherwise normal.  No exudates. Bilateral TMs are normal.  Eyes: EOM are normal. Pupils are equal, round, and reactive to light.  Neck: Normal range of motion. Neck supple.  Cardiovascular: Normal rate, regular rhythm and normal heart sounds.   Pulmonary/Chest: Effort normal and breath sounds normal. No respiratory distress. He has no wheezes. He has no rales.  Musculoskeletal: He exhibits no edema or deformity.  Neurological: He is alert and oriented to person, place, and time. No cranial  nerve deficit. Coordination normal.  Skin: Skin is warm and dry. No rash noted.  Psychiatric: He has a normal mood and affect.  Nursing note and vitals reviewed.   Urgent Care Course     Procedures (including critical care time)  Labs Review Labs Reviewed - No data to display  Imaging Review Dg Chest 2 View  Result Date: 04/20/2016 CLINICAL DATA:  Cough and fever EXAM: CHEST  2 VIEW COMPARISON:  Chest radiograph 02/07/2016 FINDINGS: Shallow lung inflation. The lungs are clear. Cardiomediastinal contours are normal. No pleural effusion or pneumothorax. IMPRESSION: Shallow lung inflation without focal airspace disease. Electronically Signed   By: Deatra Robinson M.D.   On: 04/20/2016 03:04     Visual Acuity Review  Right Eye Distance:   Left Eye Distance:   Bilateral Distance:    Right Eye Near:   Left Eye Near:    Bilateral Near:         MDM   1. Influenza    Continue with same instructions given to you by the emergency department yesterday. Do not take the ibuprofen, may take the indomethacin instead. He may also take the tramadol every 4 hours rather than every 8 hours. Be sure to drink plenty of fluids and stay well-hydrated. He may take Tylenol every 4 hours as needed. Meds ordered this encounter  Medications  . acetaminophen (TYLENOL) tablet 650 mg  . indomethacin (INDOCIN) 50 MG capsule    Sig: Take 1 capsule (50 mg total) by mouth 2 (two) times daily with a meal.    Dispense:  15 capsule    Refill:  0    Order Specific Question:   Supervising Provider    Answer:   Lonia Blood       Hayden Rasmussen, NP 04/21/16 1856

## 2016-04-21 NOTE — ED Triage Notes (Signed)
Pt c/o cold sx onset: 4 days  Sx include: HA, fevers, diaphoresis  Seen at [facility] ED yest for similar sx... Given Ibup and tamiflu,   Last had ibup today 1400  A&O x4... NAD

## 2016-07-15 ENCOUNTER — Encounter (HOSPITAL_COMMUNITY): Payer: Self-pay | Admitting: Emergency Medicine

## 2016-07-15 ENCOUNTER — Ambulatory Visit (HOSPITAL_COMMUNITY)
Admission: EM | Admit: 2016-07-15 | Discharge: 2016-07-15 | Disposition: A | Discharge status: Home / Self Care | Payer: 59 | Attending: Internal Medicine | Admitting: Internal Medicine

## 2016-07-15 DIAGNOSIS — B80 Enterobiasis: Secondary | ICD-10-CM | POA: Diagnosis not present

## 2016-07-15 DIAGNOSIS — L290 Pruritus ani: Secondary | ICD-10-CM | POA: Diagnosis not present

## 2016-07-15 HISTORY — PX: VASECTOMY: SHX75

## 2016-07-15 MED ORDER — ALBENDAZOLE 200 MG PO TABS: ORAL_TABLET | ORAL | 0 refills | Status: DC

## 2016-07-15 MED ORDER — PYRANTEL PAMOATE 144 (50 BASE) MG/ML PO SUSP: 1000.0000 mg | Freq: Once | ORAL | 0 refills | Status: AC

## 2016-07-15 NOTE — ED Provider Notes (Signed)
CSN: [id number]     Arrival date & time 07/15/16  1147 History   First MD Initiated Contact with Patient 07/15/16 1231     Chief Complaint  Patient presents with  . pinworms    per pt report from his urologist   (Consider location/radiation/quality/duration/timing/severity/associated sxs/prior Treatment) Bruce Bailey is a 41 year old African-American male that presents from his urologist office due to pinworms. Patient states that he has had anal itching/irritation for the past 3 weeks. This is worst at night. The patient underwent a routine vasectomy just prior to arrival. He told his urologist during the procedure that he was experiencing anal itching. The urologist examined the anal region and reportedly noted a pinworms. The urologist referred the patient to our facility for further evaluation and treatment. Patent denies any burning with urination, constipation, diarrhea, foul smelling urine, frequency, incontinence, nausea, suprapubic pressure, urgency or vomiting       History reviewed. No pertinent past medical history. Past Surgical History:  Procedure Laterality Date  . knee surgery x 3     . LAPAROSCOPIC APPENDECTOMY N/A 06/21/2012   Procedure: APPENDECTOMY LAPAROSCOPIC;  Surgeon: Robyne Askew, MD;  Location: [facility] OR;  Service: General;  Laterality: N/A;  . OTHER SURGICAL HISTORY Bilateral    ACL times 3  . VASECTOMY  07/15/2016   History reviewed. No pertinent family history. Social History  Substance Use Topics  . Smoking status: Never Smoker  . Smokeless tobacco: Never Used  . Alcohol use No    Review of Systems  Gastrointestinal: Negative for anal bleeding, blood in stool, constipation, diarrhea, nausea, rectal pain and vomiting.  Genitourinary: Negative for penile pain, penile swelling, scrotal swelling and testicular pain.  All other systems reviewed and are negative.   Allergies  Patient has no known allergies.  Home Medications   Prior to Admission  medications   Medication Sig Start Date End Date Taking? Authorizing Provider  acetaminophen (TYLENOL) 325 MG tablet Take 650 mg by mouth every 6 (six) hours as needed.    [provider]  Aspirin-Acetaminophen-Caffeine (GOODY HEADACHE PO) Take by mouth.    [provider]  benzonatate (TESSALON) 100 MG capsule Take 1 capsule (100 mg total) by mouth every 8 (eight) hours. 02/07/16   Charm Rings, MD  cyclobenzaprine (FLEXERIL) 10 MG tablet Take 1 tablet (10 mg total) by mouth 3 (three) times daily as needed for muscle spasms. 12/24/15   Valarie Cones, Dema Severin, PA-C  indomethacin (INDOCIN) 50 MG capsule Take 1 capsule (50 mg total) by mouth 2 (two) times daily with a meal. 04/21/16   Hayden Rasmussen, NP  oseltamivir (TAMIFLU) 75 MG capsule Take 1 capsule (75 mg total) by mouth every 12 (twelve) hours. 04/20/16   Audry Pili, PA-C  predniSONE (DELTASONE) 50 MG tablet Take 1 pill daily for 5 days. 02/07/16   Charm Rings, MD  traMADol (ULTRAM) 50 MG tablet Take 1 tablet (50 mg total) by mouth every 8 (eight) hours as needed for severe pain. 12/24/15   Valarie Cones, Dema Severin, PA-C   Meds Ordered and Administered this Visit  Medications - No data to display  BP (!) 147/95 (BP Location: Right Arm)   Pulse 65   Temp 98.7 F (37.1 C) (Oral)   SpO2 99%  No data found.   Physical Exam  Constitutional: He appears well-developed and well-nourished. No distress.  HENT:  Head: Normocephalic.  Neck: Normal range of motion.  Cardiovascular: Normal rate and regular rhythm.   Pulmonary/Chest:  Effort normal and breath sounds normal.  Genitourinary: Rectum normal.  Genitourinary Comments: Chaperone present for anal exam. Anus without any lesions. Normal rectal tone without any massess. Soft brown stool in rectal vault.     Urgent Care Course     Procedures (including critical care time)  Labs Review Labs Reviewed - No data to display  Imaging Review No results found.   Visual Acuity  Review  Right Eye Distance:   Left Eye Distance:   Bilateral Distance:    Right Eye Near:   Left Eye Near:    Bilateral Near:         MDM   1. Pinworms   2. Anal itching    Physical exam in the department did not reveal any pinworms. However; patient's urologist reportedly seen one during his exam earlier today. Because of this as well as patient's objective complaints, we will prophetically treat for pinworms.   Rx provided for albendazole 400 mg x 1 dose on empty stomach to be repeated in 2 weeks.   Patient advised to wash all bedding and clothes. Also encouraged to use good hygiene measures such as clipping of fingernails, frequent handwashing and baths to reduce reinfection and spread of infection.  Discussed diagnosis and treatment with patient and wife. All questions have been answered and all concerns have been addressed. The patient verbalized understanding and had no further questions   Bruce Bailey, Bruce Yadao, FNP 07/15/16 1252  Addendum 07/15/16 at 1400: Patient phoned clinic to report that the prescribed medication as above was extremely expensive even with his insurance coverage and requested an additional therapy. I sent an Rx pyrantel pamoate to his pharmacy. Hopefully this will be an affordable alternative.    Bruce Bailey, Bruce Pinder, FNP 07/15/16 1400

## 2016-07-15 NOTE — ED Triage Notes (Signed)
Pt states he was having a follow up from his urologist when he reported to him discomfort and itching at his rectum.  The urologist told him he had pin worms and to come here for treatment.

## 2017-10-05 DIAGNOSIS — M25511 Pain in right shoulder: Secondary | ICD-10-CM | POA: Diagnosis not present

## 2017-10-05 DIAGNOSIS — R202 Paresthesia of skin: Secondary | ICD-10-CM | POA: Diagnosis not present

## 2019-04-17 ENCOUNTER — Ambulatory Visit
Admission: RE | Admit: 2019-04-17 | Discharge: 2019-04-17 | Disposition: A | Discharge status: Home / Self Care | Payer: 59 | Source: Ambulatory Visit | Attending: Emergency Medicine | Admitting: Emergency Medicine

## 2019-04-17 ENCOUNTER — Emergency Department (HOSPITAL_COMMUNITY)
Admission: EM | Admit: 2019-04-17 | Discharge: 2019-04-18 | Disposition: A | Discharge status: Home / Self Care | Payer: 59 | Attending: Emergency Medicine | Admitting: Emergency Medicine

## 2019-04-17 ENCOUNTER — Encounter (HOSPITAL_COMMUNITY): Payer: Self-pay

## 2019-04-17 ENCOUNTER — Ambulatory Visit (INDEPENDENT_AMBULATORY_CARE_PROVIDER_SITE_OTHER)
Admission: EM | Admit: 2019-04-17 | Discharge: 2019-04-17 | Disposition: A | Discharge status: Home / Self Care | Payer: 59 | Source: Home / Self Care

## 2019-04-17 ENCOUNTER — Other Ambulatory Visit: Payer: Self-pay

## 2019-04-17 ENCOUNTER — Telehealth: Payer: Self-pay | Admitting: General Practice

## 2019-04-17 DIAGNOSIS — Z8616 Personal history of COVID-19: Secondary | ICD-10-CM | POA: Insufficient documentation

## 2019-04-17 DIAGNOSIS — J209 Acute bronchitis, unspecified: Secondary | ICD-10-CM | POA: Insufficient documentation

## 2019-04-17 DIAGNOSIS — R05 Cough: Secondary | ICD-10-CM

## 2019-04-17 DIAGNOSIS — R0602 Shortness of breath: Secondary | ICD-10-CM | POA: Insufficient documentation

## 2019-04-17 DIAGNOSIS — R519 Headache, unspecified: Secondary | ICD-10-CM | POA: Insufficient documentation

## 2019-04-17 DIAGNOSIS — R059 Cough, unspecified: Secondary | ICD-10-CM

## 2019-04-17 LAB — CBC
HCT: 41.6 % (ref 39.0–52.0)
Hemoglobin: 13.8 g/dL (ref 13.0–17.0)
MCH: 31.4 pg (ref 26.0–34.0)
MCHC: 33.2 g/dL (ref 30.0–36.0)
MCV: 94.5 fL (ref 80.0–100.0)
Platelets: 222 10*3/uL (ref 150–400)
RBC: 4.4 MIL/uL (ref 4.22–5.81)
RDW: 12.8 % (ref 11.5–15.5)
WBC: 6.3 10*3/uL (ref 4.0–10.5)
nRBC: 0 % (ref 0.0–0.2)

## 2019-04-17 LAB — BASIC METABOLIC PANEL
Anion gap: 10 (ref 5–15)
BUN: 12 mg/dL (ref 6–20)
CO2: 26 mmol/L (ref 22–32)
Calcium: 9.4 mg/dL (ref 8.9–10.3)
Chloride: 102 mmol/L (ref 98–111)
Creatinine, Ser: 1.13 mg/dL (ref 0.61–1.24)
GFR calc Af Amer: >60 mL/min (ref >60)
GFR calc non Af Amer: >60 mL/min (ref >60)
Glucose, Bld: 151 mg/dL — ABNORMAL HIGH (ref 70–99)
Potassium: 4.3 mmol/L (ref 3.5–5.1)
Sodium: 138 mmol/L (ref 135–145)

## 2019-04-17 MED ORDER — SODIUM CHLORIDE 0.9% FLUSH: 3.0000 mL | Freq: Once | INTRAVENOUS | Status: DC

## 2019-04-17 MED ORDER — BENZONATATE 100 MG PO CAPS: 100.0000 mg | ORAL_CAPSULE | Freq: Three times a day (TID) | ORAL | 0 refills | Status: DC | PRN

## 2019-04-17 NOTE — ED Triage Notes (Signed)
Patient was positive for Covid on 03/19/19. States that his cough has continued and worsened today and his employer sent him home. Patient states that now he feels like his chest is sore and he is concerned for pneumonia or something else.

## 2019-04-17 NOTE — Discharge Instructions (Addendum)
Go directly to [facility] for your chest xray.  I will call you with the results this afternoon.    Take the [facility] as needed for your cough.    Follow up with your primary care provider if your symptoms are not improving.    Go to the emergency department if you have acute worsening symptoms or develop new symptoms such as shortness of breath.

## 2019-04-17 NOTE — ED Notes (Signed)
Pt called for vitals recheck in lobby no answer 

## 2019-04-17 NOTE — ED Triage Notes (Addendum)
Pt presents to ED with ongoing covid symptoms, covid + 03/18/19, results came back negative 04/12/2019. Pt is concerned for pneumonia

## 2019-04-17 NOTE — ED Provider Notes (Addendum)
[name]    CSN: [id number] Arrival date & time: 04/17/19  1504      History   Chief Complaint Chief Complaint  Patient presents with  . Cough    HPI Bruce Bailey is a 44 y.o. male. Patient presents with cough production of white phlegm and chest congestion x 3 weeks since testing positive for COVID on 03/20/2019.  He denies fever, chills, shortness of breath, vomiting, diarrhea, rash, or other symptoms.  He attempted treatment at home with OTC cough medication.  He states he contacted his PCP today and was instructed to come here for a chest xray.     The history is provided by the patient.    History reviewed. No pertinent past medical history.  There are no problems to display for this patient.   Past Surgical History:  Procedure Laterality Date  . knee surgery x 3     . LAPAROSCOPIC APPENDECTOMY N/A 06/21/2012   Procedure: APPENDECTOMY LAPAROSCOPIC;  Surgeon: Robyne Askew, MD;  Location: [facility] OR;  Service: General;  Laterality: N/A;  . OTHER SURGICAL HISTORY Bilateral    ACL times 3  . VASECTOMY  07/15/2016       Home Medications    Prior to Admission medications   Medication Sig Start Date End Date Taking? Authorizing Provider  acetaminophen (TYLENOL) 325 MG tablet Take 650 mg by mouth every 6 (six) hours as needed.    [provider]  albendazole (ALBENZA) 200 MG tablet Take 400 mg orally once on an empty stomach and repeat dose in two weeks 07/15/16   Lurline Idol, FNP  Aspirin-Acetaminophen-Caffeine (GOODY HEADACHE PO) Take by mouth.    [provider]  benzonatate (TESSALON) 100 MG capsule Take 1 capsule (100 mg total) by mouth 3 (three) times daily as needed for cough. 04/17/19   Mickie Bail, NP  cyclobenzaprine (FLEXERIL) 10 MG tablet Take 1 tablet (10 mg total) by mouth 3 (three) times daily as needed for muscle spasms. 12/24/15   Valarie Cones, Dema Severin, PA-C  indomethacin (INDOCIN) 50 MG capsule Take 1 capsule (50 mg total) by  mouth 2 (two) times daily with a meal. 04/21/16   Hayden Rasmussen, NP  oseltamivir (TAMIFLU) 75 MG capsule Take 1 capsule (75 mg total) by mouth every 12 (twelve) hours. 04/20/16   Audry Pili, PA-C  predniSONE (DELTASONE) 50 MG tablet Take 1 pill daily for 5 days. 02/07/16   Charm Rings, MD  traMADol (ULTRAM) 50 MG tablet Take 1 tablet (50 mg total) by mouth every 8 (eight) hours as needed for severe pain. 12/24/15   Valarie Cones Dema Severin, PA-C    Family History History reviewed. No pertinent family history.  Social History Social History   Tobacco Use  . Smoking status: Never Smoker  . Smokeless tobacco: Never Used  Substance Use Topics  . Alcohol use: No  . Drug use: No     Allergies   Patient has no known allergies.   Review of Systems Review of Systems  Constitutional: Negative for chills and fever.  HENT: Negative for ear pain and sore throat.   Eyes: Negative for pain and visual disturbance.  Respiratory: Positive for cough. Negative for shortness of breath.   Cardiovascular: Negative for chest pain and palpitations.  Gastrointestinal: Negative for abdominal pain, diarrhea, nausea and vomiting.  Genitourinary: Negative for dysuria and hematuria.  Musculoskeletal: Negative for arthralgias and back pain.  Skin: Negative for color change and rash.  Neurological: Negative for  seizures and syncope.  All other systems reviewed and are negative.    Physical Exam Triage Vital Signs ED Triage Vitals  Enc Vitals Group     BP      Pulse      Resp      Temp      Temp src      SpO2      Weight      Height      Head Circumference      Peak Flow      Pain Score      Pain Loc      Pain Edu?      Excl. in GC?    No data found.  Updated Vital Signs BP 119/71 (BP Location: Left Arm)   Pulse 69   Temp 98.3 F (36.8 C) (Oral)   Resp 18   Ht 5\' 8"  (1.727 m)   Wt 225 lb (102.1 kg)   SpO2 93%   BMI 34.21 kg/m   Visual Acuity Right Eye Distance:   Left Eye Distance:     Bilateral Distance:    Right Eye Near:   Left Eye Near:    Bilateral Near:     Physical Exam Vitals and nursing note reviewed.  Constitutional:      General: He is not in acute distress.    Appearance: He is well-developed. He is not ill-appearing.  HENT:     Head: Normocephalic and atraumatic.     Right Ear: Tympanic membrane normal.     Left Ear: Tympanic membrane normal.     Nose: Nose normal.     Mouth/Throat:     Mouth: Mucous membranes are moist.     Pharynx: Oropharynx is clear.  Eyes:     Conjunctiva/sclera: Conjunctivae normal.  Cardiovascular:     Rate and Rhythm: Normal rate and regular rhythm.     Heart sounds: Normal heart sounds. No murmur.  Pulmonary:     Effort: Pulmonary effort is normal. No respiratory distress.     Breath sounds: Normal breath sounds. No wheezing or rhonchi.  Abdominal:     General: Bowel sounds are normal.     Palpations: Abdomen is soft.     Tenderness: There is no abdominal tenderness. There is no guarding or rebound.  Musculoskeletal:     Cervical back: Neck supple.  Skin:    General: Skin is warm and dry.     Findings: No rash.  Neurological:     General: No focal deficit present.     Mental Status: He is alert and oriented to person, place, and time.  Psychiatric:        Mood and Affect: Mood normal.        Behavior: Behavior normal.      UC Treatments / Results  Labs (all labs ordered are listed, but only abnormal results are displayed) Labs Reviewed - No data to display  EKG   Radiology DG Chest 2 View  Result Date: 04/17/2019 CLINICAL DATA:  Productive cough for 3 weeks EXAM: CHEST - 2 VIEW COMPARISON:  04/20/2016 FINDINGS: Borderline to mild cardiomegaly. Both lungs are clear. The visualized skeletal structures are unremarkable. IMPRESSION: No active cardiopulmonary disease. Electronically Signed   By: 04/22/2016 M.D.   On: 04/17/2019 16:00    Procedures Procedures (including critical care  time)  Medications Ordered in UC Medications - No data to display  Initial Impression / Assessment and Plan / UC Course  I have  reviewed the triage vital signs and the nursing notes.  Pertinent labs & imaging results that were available during my care of the patient were reviewed by me and considered in my medical decision making (see chart for details).   Cough.  Patient is well-appearing and his exam is unremarkable.  Chest xray negative; discussed results with patient.  Treating cough with Tessalon Perles.  Instructed patient to follow up with his PCP if his symptoms are not improving.  Instructed him to go to the ED if he has acute SOB or other concerning symptoms.  Patient agrees to plan of care.     Final Clinical Impressions(s) / UC Diagnoses   Final diagnoses:  Cough     Discharge Instructions     Go directly to [facility] for your chest xray.  I will call you with the results this afternoon.    Take the [facility] as needed for your cough.    Follow up with your primary care provider if your symptoms are not improving.        ED Prescriptions    Medication Sig Dispense Auth. Provider   benzonatate (TESSALON) 100 MG capsule Take 1 capsule (100 mg total) by mouth 3 (three) times daily as needed for cough. 21 capsule Sharion Balloon, NP     PDMP not reviewed this encounter.   Sharion Balloon, NP 04/17/19 1608    Sharion Balloon, NP 04/17/19 1615

## 2019-04-17 NOTE — Telephone Encounter (Signed)
Pt called wanting to be seen sooner than 4/20 for cough/headache.  Pt stated he has had covid and has tested twice neg but still has a cough.  He stated he coughs so much his head hurts.  Offered Monday with dr cody  for acute visit.  Pt asked about urgent cares.  I gave him some name of urgent cares in [place].  PT stated no one wanted to see him.  I ask pt if I could call urgent care in [place] [phone] to see what there policy was.  They stated pt could come and see them today.  He didn't need to make appointment and they were not busy at the time.  The only thing they said was they have no xray at this time if pt needed xray he would have to go to [facility] for this.  And they would help him with this.  Pt is aware and stated he was going to go there now

## 2019-04-18 ENCOUNTER — Emergency Department (HOSPITAL_COMMUNITY): Payer: 59

## 2019-04-18 MED ORDER — BENZONATATE 100 MG PO CAPS
200.0000 mg | ORAL_CAPSULE | Freq: Once | ORAL | Status: AC
Start: 2019-04-18 — End: 2019-04-18
  Administered 2019-04-18: 200 mg via ORAL
  Filled 2019-04-18: qty 2

## 2019-04-18 MED ORDER — ALBUTEROL SULFATE HFA 108 (90 BASE) MCG/ACT IN AERS
2.0000 | INHALATION_SPRAY | Freq: Once | RESPIRATORY_TRACT | Status: AC
  Administered 2019-04-18: 2 via RESPIRATORY_TRACT
  Filled 2019-04-18: qty 6.7

## 2019-04-18 MED ORDER — BENZONATATE 100 MG PO CAPS: 100.0000 mg | ORAL_CAPSULE | Freq: Three times a day (TID) | ORAL | 0 refills | Status: DC | PRN

## 2019-04-18 MED ORDER — AEROCHAMBER PLUS FLO-VU LARGE MISC
1.0000 | Freq: Once | Status: AC
  Administered 2019-04-18: 1

## 2019-04-18 MED ORDER — METHYLPREDNISOLONE 4 MG PO TBPK: ORAL_TABLET | ORAL | 0 refills | Status: DC

## 2019-04-18 MED ORDER — DEXAMETHASONE SODIUM PHOSPHATE 10 MG/ML IJ SOLN
10.0000 mg | Freq: Once | INTRAMUSCULAR | Status: AC
  Administered 2019-04-18: 10 mg via INTRAMUSCULAR
  Filled 2019-04-18: qty 1

## 2019-04-18 MED ORDER — ACETAMINOPHEN 500 MG PO TABS
1000.0000 mg | ORAL_TABLET | Freq: Once | ORAL | Status: AC
Start: 2019-04-18 — End: 2019-04-18
  Administered 2019-04-18: 01:00:00 1000 mg via ORAL
  Filled 2019-04-18: qty 2

## 2019-04-18 NOTE — ED Provider Notes (Signed)
[name]   CSN: [id number] Arrival date & time: 04/17/19  1658     History Chief Complaint  Patient presents with  . Cough  . Headache  . Shortness of Breath    Bruce Bailey is a 44 y.o. male who presents emergency department with several complaints.  Chiefly patient has had a persistent cough.  He complains of of onset of urgency to cough followed by paroxysms of severe coughing to breathlessness.  He has had some wheezing and felt short of breath.  He has had 2 episodes of hypoxia 1 about a week ago when he went to the fire station and they showed his oxygen saturation to be 89%.  Patient was seen at an urgent care earlier today for the cough and prescribed benzonatate.  He had a chest x-ray which showed no acute abnormalities and borderline to mild cardiomegaly.  Patient states that he became worried about it.  His wife urged him to go back to another urgent care or the ER if he presented here.  He denies chest pain, fevers.  He has no history of asthma.  He is not taking anything for cough prior to arrival.  He has an associated headache due to coughing.  He denies changes in vision, neck stiffness or fever.  Patient had coronavirus prior to the onset of these persistent cough symptoms.  HPI     History reviewed. No pertinent past medical history.  There are no problems to display for this patient.   Past Surgical History:  Procedure Laterality Date  . knee surgery x 3     . LAPAROSCOPIC APPENDECTOMY N/A 06/21/2012   Procedure: APPENDECTOMY LAPAROSCOPIC;  Surgeon: Merrie Roof, MD;  Location: [facility];  Service: General;  Laterality: N/A;  . OTHER SURGICAL HISTORY Bilateral    ACL times 3  . VASECTOMY  07/15/2016       History reviewed. No pertinent family history.  Social History   Tobacco Use  . Smoking status: Never Smoker  . Smokeless tobacco: Never Used  Substance Use Topics  . Alcohol use: No  . Drug use:  No    Home Medications Prior to Admission medications   Medication Sig Start Date End Date Taking? Authorizing Provider  acetaminophen (TYLENOL) 325 MG tablet Take 650 mg by mouth every 6 (six) hours as needed.    [provider]  albendazole (ALBENZA) 200 MG tablet Take 400 mg orally once on an empty stomach and repeat dose in two weeks 07/15/16   Enrique Sack, FNP  Aspirin-Acetaminophen-Caffeine (GOODY HEADACHE PO) Take by mouth.    [provider]  benzonatate (TESSALON) 100 MG capsule Take 1 capsule (100 mg total) by mouth 3 (three) times daily as needed for cough. 04/17/19   Sharion Balloon, NP  cyclobenzaprine (FLEXERIL) 10 MG tablet Take 1 tablet (10 mg total) by mouth 3 (three) times daily as needed for muscle spasms. 12/24/15   Gale Journey, Damaris Hippo, PA-C  indomethacin (INDOCIN) 50 MG capsule Take 1 capsule (50 mg total) by mouth 2 (two) times daily with a meal. 04/21/16   Janne Napoleon, NP  oseltamivir (TAMIFLU) 75 MG capsule Take 1 capsule (75 mg total) by mouth every 12 (twelve) hours. 04/20/16   Shary Decamp, PA-C  predniSONE (DELTASONE) 50 MG tablet Take 1 pill daily for 5 days. 02/07/16   Melony Overly, MD  traMADol (ULTRAM) 50 MG tablet Take 1 tablet (50 mg total) by mouth  every 8 (eight) hours as needed for severe pain. 12/24/15   Valarie Cones, Dema Severin, PA-C    Allergies    Patient has no known allergies.  Review of Systems   Review of Systems Ten systems reviewed and are negative for acute change, except as noted in the HPI.   Physical Exam Updated Vital Signs BP 123/83   Pulse (!) 57   Temp 98.4 F (36.9 C) (Oral)   Resp 16   Ht 5\' 8"  (1.727 m)   Wt 102.1 kg   SpO2 96%   BMI 34.21 kg/m   Physical Exam Vitals and nursing note reviewed.  Constitutional:      General: He is not in acute distress.    Appearance: He is well-developed. He is not diaphoretic.  HENT:     Head: Normocephalic and atraumatic.  Eyes:     General: No scleral icterus.     Conjunctiva/sclera: Conjunctivae normal.  Cardiovascular:     Rate and Rhythm: Normal rate and regular rhythm.     Heart sounds: Normal heart sounds.  Pulmonary:     Effort: Pulmonary effort is normal. No respiratory distress.     Breath sounds: Wheezing present.  Abdominal:     Palpations: Abdomen is soft.     Tenderness: There is no abdominal tenderness.  Musculoskeletal:     Cervical back: Normal range of motion and neck supple.  Skin:    General: Skin is warm and dry.  Neurological:     Mental Status: He is alert.  Psychiatric:        Behavior: Behavior normal.     ED Results / Procedures / Treatments   Labs (all labs ordered are listed, but only abnormal results are displayed) Labs Reviewed  BASIC METABOLIC PANEL - Abnormal; Notable for the following components:      Result Value   Glucose, Bld 151 (*)    All other components within normal limits  CBC    EKG None  Radiology DG Chest 2 View  Result Date: 04/17/2019 CLINICAL DATA:  Productive cough for 3 weeks EXAM: CHEST - 2 VIEW COMPARISON:  04/20/2016 FINDINGS: Borderline to mild cardiomegaly. Both lungs are clear. The visualized skeletal structures are unremarkable. IMPRESSION: No active cardiopulmonary disease. Electronically Signed   By: 04/22/2016 M.D.   On: 04/17/2019 16:00    Procedures Procedures (including critical care time)  Medications Ordered in ED Medications  sodium chloride flush (NS) 0.9 % injection 3 mL (3 mLs Intravenous Not Given 04/18/19 0013)  dexamethasone (DECADRON) injection 10 mg (10 mg Intramuscular Given 04/18/19 0122)  albuterol (VENTOLIN HFA) 108 (90 Base) MCG/ACT inhaler 2 puff (2 puffs Inhalation Given 04/18/19 0123)  AeroChamber Plus Flo-Vu Large MISC 1 each (1 each Other Given 04/18/19 0123)  benzonatate (TESSALON) capsule 200 mg (200 mg Oral Given 04/18/19 0122)  acetaminophen (TYLENOL) tablet 1,000 mg (1,000 mg Oral Given 04/18/19 0122)    ED Course  I have reviewed the  triage vital signs and the nursing notes.  Pertinent labs & imaging results that were available during my care of the patient were reviewed by me and considered in my medical decision making (see chart for details).    MDM Rules/Calculators/A&P                      44 year old male with recent Covid infection here with chronic cough, headache with cough.  He had a chest x-ray that showed some borderline to mild cardiomegaly which  was concerning.  I reviewed the patient's outpatient x-ray which shows no acute abnormalities on my interpretation patient's labs show mildly elevated blood glucose today, CBC without abnormality.  Patient has obvious coughing and wheezing , plus his clinical presentation is consistent with bronchospasm after recent URI.  Patient given albuterol, Decadron and Tessalon here in the emergency department with significant improvement in his symptoms.  Patient states " I can take a full breath now." Discharged with instructions to use albuterol 2 puffs every 4 hours as needed cough and wheezing, Tessalon Perles and Medrol Dosepak.  Discussed return precautions.  He may follow-up outpatient with cardiology for concerns about cardiomegaly.  Final Clinical Impression(s) / ED Diagnoses Final diagnoses:  Bronchospasm with bronchitis, acute    Rx / DC Orders ED Discharge Orders    None       Arthor Captain, PA-C 04/18/19 0622    Nira Conn, MD 04/18/19 [phone]

## 2019-04-18 NOTE — Discharge Instructions (Signed)
Contact a health care provider if: °You have muscle aches. °You have chest pain. °The mucus that you cough up (sputum) changes from clear or white to yellow, green, gray, or bloody. °You have a fever. °Your sputum gets thicker. °Get help right away if: °Your wheezing and coughing get worse, even after you take your prescribed medicines. °It gets even harder to breathe. °You develop severe chest pain. °

## 2019-04-28 NOTE — Progress Notes (Signed)
Cardiology Office Consult Note    Date:  04/29/2019   ID:  Lemuel Boodram, DOB 1975-06-14, MRN [id number]  PCP:  Patient, No Pcp Per  Cardiologist:  Armanda Magic, MD   Chief Complaint  Patient presents with  . New Patient (Initial Visit)    Cardiomegaly    History of Present Illness:  Bruce Bailey is a 44 y.o. male who is being seen today for the evaluation of Cardiomegaly at the request of Selena Batten, Jessica R, MD.  Lowella Dandy is is a morbidly obese male with a hx of chronic SOB who recently underwent cxray which was normal except for mild cardiomegaly.  He is now referred for evaluation.  He is here today and is doing well.  He denies any  SOB, DOE, PND, orthopnea, LE edema, dizziness, palpitations or syncope. He says that occasionally he will have a pressure over his left breast that is nonexertional and resolves with deep breathing and drinking water.  This occurs about once monthly with no associated sx of Nausea, diaphoresis or SOB.  He has no hx of tobacco use and no fm hx of CAD but his mom just went to see a Cardiologist and she was told to have all her children screened but she does not know for what.  His cough has resolved.He is compliant with his meds and is tolerating meds with no SE.    Past Medical History:  Diagnosis Date  . Acute appendicitis without mention of peritonitis 06/21/2012  . Cough   . Headache   . SOB (shortness of breath)     Past Surgical History:  Procedure Laterality Date  . knee surgery x 3     . LAPAROSCOPIC APPENDECTOMY N/A 06/21/2012   Procedure: APPENDECTOMY LAPAROSCOPIC;  Surgeon: Robyne Askew, MD;  Location: [facility] OR;  Service: General;  Laterality: N/A;  . OTHER SURGICAL HISTORY Bilateral    ACL times 3  . VASECTOMY  07/15/2016    Current Medications: Current Meds  Medication Sig  . benzonatate (TESSALON) 100 MG capsule Take 1 capsule (100 mg total) by mouth 3 (three) times daily as needed for cough.    Allergies:   Patient has no known allergies.    Social History   Socioeconomic History  . Marital status: Married    Spouse name: Not on file  . Number of children: Not on file  . Years of education: Not on file  . Highest education level: Not on file  Occupational History  . Not on file  Tobacco Use  . Smoking status: Never Smoker  . Smokeless tobacco: Never Used  Substance and Sexual Activity  . Alcohol use: No  . Drug use: No  . Sexual activity: Yes  Other Topics Concern  . Not on file  Social History Narrative  . Not on file   Social Determinants of Health   Financial Resource Strain:   . Difficulty of Paying Living Expenses: Not on file  Food Insecurity:   . Worried About Programme researcher, broadcasting/film/video in the Last Year: Not on file  . Ran Out of Food in the Last Year: Not on file  Transportation Needs:   . Lack of Transportation (Medical): Not on file  . Lack of Transportation (Non-Medical): Not on file  Physical Activity:   . Days of Exercise per Week: Not on file  . Minutes of Exercise per Session: Not on file  Stress:   . Feeling of Stress : Not on file  Social Connections:   .  Frequency of Communication with Friends and Family: Not on file  . Frequency of Social Gatherings with Friends and Family: Not on file  . Attends Religious Services: Not on file  . Active Member of Clubs or Organizations: Not on file  . Attends Banker Meetings: Not on file  . Marital Status: Not on file     Family History:  The patient's family history is not on file.   ROS:   Please see the history of present illness.    ROS All other systems reviewed and are negative.  No flowsheet data found.  PHYSICAL EXAM:   VS:  BP 118/60   Pulse (!) 54   Wt 245 lb (111.1 kg)   SpO2 96%   BMI 37.25 kg/m    GEN: Well nourished, well developed, in no acute distress  HEENT: normal  Neck: no JVD, carotid bruits, or masses Cardiac: RRR; no murmurs, rubs, or gallops,no edema.  Intact distal pulses bilaterally.  Respiratory:   clear to auscultation bilaterally, normal work of breathing GI: soft, nontender, nondistended, + BS MS: no deformity or atrophy  Skin: warm and dry, no rash Neuro:  Alert and Oriented x 3, Strength and sensation are intact Psych: euthymic mood, full affect  Wt Readings from Last 3 Encounters:  04/29/19 245 lb (111.1 kg)  04/18/19 225 lb (102.1 kg)  04/17/19 225 lb (102.1 kg)      Studies/Labs Reviewed:   EKG:  EKG is ordered today.  The ekg ordered today demonstrates   Recent Labs: 04/17/2019: BUN 12; Creatinine, Ser 1.13; Hemoglobin 13.8; Platelets 222; Potassium 4.3; Sodium 138   Lipid Panel No results found for: CHOL, TRIG, HDL, CHOLHDL, VLDL, LDLCALC, LDLDIRECT  Additional studies/ records that were reviewed today include:  Office notes from PCP    ASSESSMENT:    1. Cardiomegaly   2. Morbid obesity (HCC)   3. Chest pain of uncertain etiology      PLAN:  In order of problems listed above:  1. Cardiomegaly -noted on Cxray and likely reflects a large pericardial fat pad -he is asymptomatic except for chronic cough -will get a 2D echo to assess for LV size and function  2.  Morbid Obesity -I have encouraged him to get into a routine exercise program and cut back on carbs and portions.   3. Atypical CP -? Etiology but likely FERD -he does have nonspecific T wave changes on his EKG in the anterolateral leads -will get an ETT to rule out ischemia -check 2D echo -he will find out from his mother's MD why they recommended cardiac screening for her children  Medication Adjustments/Labs and Tests Ordered: Current medicines are reviewed at length with the patient today.  Concerns regarding medicines are outlined above.  Medication changes, Labs and Tests ordered today are listed in the Patient Instructions below.  Patient Instructions  Medication Instructions:  Your physician recommends that you continue on your current medications as directed. Please refer to the  Current Medication list given to you today.  *If you need a refill on your cardiac medications before your next appointment, please call your pharmacy*   Testing/Procedures: Your physician has requested that you have an exercise tolerance test. For further information please visit [email or web address] Please also follow instruction sheet, as given.  Your physician has requested that you have an echocardiogram. Echocardiography is a painless test that uses sound waves to create images of your heart. It provides your doctor with information about the size and  shape of your heart and how well your heart's chambers and valves are working. This procedure takes approximately one hour. There are no restrictions for this procedure.  Follow-Up: At [facility], you and your health needs are our priority.  As part of our continuing mission to provide you with exceptional heart care, we have created designated Provider Care Teams.  These Care Teams include your primary Cardiologist (physician) and Advanced Practice Providers (APPs -  Physician Assistants and Nurse Practitioners) who all work together to provide you with the care you need, when you need it.  We recommend signing up for the patient portal called "MyChart".  Sign up information is provided on this After Visit Summary.  MyChart is used to connect with patients for Virtual Visits (Telemedicine).  Patients are able to view lab/test results, encounter notes, upcoming appointments, etc.  Non-urgent messages can be sent to your provider as well.   To learn more about what you can do with MyChart, go to [email or web address].    Follow up with Dr. Radford Pax as needed based on results of testing.       Signed, Fransico Him, MD  04/29/2019 10:50 AM    [address], [place], [place]  [address] Phone: [phone]; Fax: [phone]

## 2019-04-29 ENCOUNTER — Other Ambulatory Visit: Payer: Self-pay

## 2019-04-29 ENCOUNTER — Ambulatory Visit (INDEPENDENT_AMBULATORY_CARE_PROVIDER_SITE_OTHER): Payer: 59 | Admitting: Cardiology

## 2019-04-29 ENCOUNTER — Encounter: Payer: Self-pay | Admitting: Cardiology

## 2019-04-29 VITALS — BP 118/60 | HR 54 | Wt 245.0 lb

## 2019-04-29 DIAGNOSIS — R079 Chest pain, unspecified: Secondary | ICD-10-CM

## 2019-04-29 DIAGNOSIS — I517 Cardiomegaly: Secondary | ICD-10-CM

## 2019-04-29 NOTE — Patient Instructions (Signed)
Medication Instructions:  Your physician recommends that you continue on your current medications as directed. Please refer to the Current Medication list given to you today.  *If you need a refill on your cardiac medications before your next appointment, please call your pharmacy*   Testing/Procedures: Your physician has requested that you have an exercise tolerance test. For further information please visit [email or web address] Please also follow instruction sheet, as given.  Your physician has requested that you have an echocardiogram. Echocardiography is a painless test that uses sound waves to create images of your heart. It provides your doctor with information about the size and shape of your heart and how well your heart's chambers and valves are working. This procedure takes approximately one hour. There are no restrictions for this procedure.  Follow-Up: At [facility], you and your health needs are our priority.  As part of our continuing mission to provide you with exceptional heart care, we have created designated Provider Care Teams.  These Care Teams include your primary Cardiologist (physician) and Advanced Practice Providers (APPs -  Physician Assistants and Nurse Practitioners) who all work together to provide you with the care you need, when you need it.  We recommend signing up for the patient portal called "MyChart".  Sign up information is provided on this After Visit Summary.  MyChart is used to connect with patients for Virtual Visits (Telemedicine).  Patients are able to view lab/test results, encounter notes, upcoming appointments, etc.  Non-urgent messages can be sent to your provider as well.   To learn more about what you can do with MyChart, go to [email or web address].    Follow up with Dr. Mayford Knife as needed based on results of testing.

## 2019-04-30 NOTE — Addendum Note (Signed)
Addended by: Winifred Olive on: 04/30/2019 02:43 PM   Modules accepted: Orders

## 2019-05-06 ENCOUNTER — Ambulatory Visit: Payer: 59 | Admitting: Interventional Cardiology

## 2019-05-24 ENCOUNTER — Inpatient Hospital Stay (HOSPITAL_COMMUNITY): Admission: RE | Admit: 2019-05-24 | Payer: 59 | Source: Ambulatory Visit

## 2019-05-28 ENCOUNTER — Ambulatory Visit (HOSPITAL_COMMUNITY): Payer: 59 | Attending: Cardiology

## 2019-05-28 ENCOUNTER — Ambulatory Visit: Payer: 59

## 2019-05-28 ENCOUNTER — Other Ambulatory Visit: Payer: Self-pay

## 2019-05-28 DIAGNOSIS — R079 Chest pain, unspecified: Secondary | ICD-10-CM | POA: Insufficient documentation

## 2019-05-30 ENCOUNTER — Telehealth: Payer: Self-pay

## 2019-05-30 DIAGNOSIS — I517 Cardiomegaly: Secondary | ICD-10-CM

## 2019-05-30 NOTE — Telephone Encounter (Signed)
The patient has been notified of the result and verbalized understanding. Patient is hesitant to proceed with further testing.

## 2019-05-30 NOTE — Telephone Encounter (Signed)
-----   Message from Quintella Reichert, MD sent at 05/28/2019 10:04 PM EDT ----- Eco showed normal heart function with mildy thickened heart muscle, mildly enlarged RV

## 2019-05-30 NOTE — Telephone Encounter (Signed)
Left message for patient with results. Advised patient to call back to discuss further testing that is recommended.

## 2019-05-30 NOTE — Telephone Encounter (Signed)
Patient called back returning Carlyle's call 

## 2019-05-30 NOTE — Telephone Encounter (Signed)
The patient has been notified of the result and verbalized understanding.  All questions (if any) were answered. Theresia Majors, RN 05/30/2019 3:36 PM

## 2019-05-30 NOTE — Telephone Encounter (Signed)
-----   Message from Quintella Reichert, MD sent at 05/30/2019 12:04 PM EDT ----- Please let patient know that I said that right ventrciular enlargement is not normal for a person his age and we need to determine why this has occurred because if it gets worse he may end up with heart failure.  Traci ----- Message ----- From: Theresia Majors, RN Sent: 05/30/2019  11:04 AM EDT To: Quintella Reichert, MD  Patient states that he has history of heart failure in his family. Advised patient on further testing recommendations but patient is not wanting to follow through because he is not having any symptoms. Advised him on consequences of untreated thromboembolic disease. Patient would like further explanation from Dr. Mayford Knife before preceding with further testing.

## 2019-06-18 ENCOUNTER — Ambulatory Visit: Payer: 59 | Admitting: Family Medicine

## 2019-06-20 ENCOUNTER — Other Ambulatory Visit (HOSPITAL_COMMUNITY)
Admission: RE | Admit: 2019-06-20 | Discharge: 2019-06-20 | Disposition: A | Discharge status: Home / Self Care | Payer: 59 | Source: Ambulatory Visit | Attending: Cardiology | Admitting: Cardiology

## 2019-06-20 DIAGNOSIS — Z20822 Contact with and (suspected) exposure to covid-19: Secondary | ICD-10-CM | POA: Diagnosis not present

## 2019-06-20 DIAGNOSIS — Z01812 Encounter for preprocedural laboratory examination: Secondary | ICD-10-CM | POA: Insufficient documentation

## 2019-06-20 LAB — SARS CORONAVIRUS 2 (TAT 6-24 HRS): SARS Coronavirus 2: NEGATIVE

## 2019-06-24 ENCOUNTER — Other Ambulatory Visit: Payer: Self-pay

## 2019-06-24 ENCOUNTER — Ambulatory Visit (HOSPITAL_COMMUNITY)
Admission: RE | Admit: 2019-06-24 | Discharge: 2019-06-24 | Disposition: A | Discharge status: Home / Self Care | Payer: 59 | Source: Ambulatory Visit | Attending: Cardiology | Admitting: Cardiology

## 2019-06-24 ENCOUNTER — Encounter (HOSPITAL_COMMUNITY)
Admission: RE | Admit: 2019-06-24 | Discharge: 2019-06-24 | Disposition: A | Discharge status: Home / Self Care | Payer: 59 | Source: Ambulatory Visit | Attending: Cardiology | Admitting: Cardiology

## 2019-06-24 ENCOUNTER — Ambulatory Visit (INDEPENDENT_AMBULATORY_CARE_PROVIDER_SITE_OTHER): Payer: 59 | Admitting: Internal Medicine

## 2019-06-24 ENCOUNTER — Other Ambulatory Visit: Payer: Self-pay | Admitting: Cardiology

## 2019-06-24 DIAGNOSIS — I517 Cardiomegaly: Secondary | ICD-10-CM | POA: Insufficient documentation

## 2019-06-24 LAB — PULMONARY FUNCTION TEST
DL/VA % pred: 119 %
DL/VA: 5.51 ml/min/mmHg/L
DLCO cor % pred: 101 %
DLCO cor: 30.15 ml/min/mmHg
DLCO unc % pred: 101 %
DLCO unc: 30.15 ml/min/mmHg
FEF 25-75 Post: 5.39 L/sec
FEF 25-75 Pre: 5.13 L/sec
FEF2575-%Change-Post: 5 %
FEF2575-%Pred-Post: 150 %
FEF2575-%Pred-Pre: 143 %
FEV1-%Change-Post: 1 %
FEV1-%Pred-Post: 97 %
FEV1-%Pred-Pre: 95 %
FEV1-Post: 3.33 L
FEV1-Pre: 3.28 L
FEV1FVC-%Change-Post: 2 %
FEV1FVC-%Pred-Pre: 110 %
FEV6-%Change-Post: 0 %
FEV6-%Pred-Post: 88 %
FEV6-%Pred-Pre: 88 %
FEV6-Post: 3.62 L
FEV6-Pre: 3.65 L
FEV6FVC-%Pred-Post: 102 %
FEV6FVC-%Pred-Pre: 102 %
FVC-%Change-Post: 0 %
FVC-%Pred-Post: 86 %
FVC-%Pred-Pre: 86 %
FVC-Post: 3.62 L
FVC-Pre: 3.65 L
Post FEV1/FVC ratio: 92 %
Post FEV6/FVC ratio: 100 %
Pre FEV1/FVC ratio: 90 %
Pre FEV6/FVC Ratio: 100 %
RV % pred: 85 %
RV: 1.56 L
TLC % pred: 86 %
TLC: 5.83 L

## 2019-06-24 MED ORDER — TECHNETIUM TO 99M ALBUMIN AGGREGATED
1.7000 | Freq: Once | INTRAVENOUS | Status: AC | PRN
  Administered 2019-06-24: 1.7 via INTRAVENOUS

## 2019-06-24 NOTE — Progress Notes (Signed)
Full PFT performed today. °

## 2019-07-10 ENCOUNTER — Other Ambulatory Visit: Payer: Self-pay

## 2019-07-10 ENCOUNTER — Encounter: Payer: Self-pay | Admitting: Family Medicine

## 2019-07-10 ENCOUNTER — Ambulatory Visit (INDEPENDENT_AMBULATORY_CARE_PROVIDER_SITE_OTHER): Payer: 59 | Admitting: Family Medicine

## 2019-07-10 VITALS — BP 114/78 | HR 52 | Temp 97.4°F | Resp 20 | Ht 68.0 in | Wt 235.8 lb

## 2019-07-10 DIAGNOSIS — Z6835 Body mass index (BMI) 35.0-35.9, adult: Secondary | ICD-10-CM | POA: Diagnosis not present

## 2019-07-10 DIAGNOSIS — I517 Cardiomegaly: Secondary | ICD-10-CM | POA: Diagnosis not present

## 2019-07-10 DIAGNOSIS — L237 Allergic contact dermatitis due to plants, except food: Secondary | ICD-10-CM | POA: Diagnosis not present

## 2019-07-10 DIAGNOSIS — E669 Obesity, unspecified: Secondary | ICD-10-CM | POA: Insufficient documentation

## 2019-07-10 DIAGNOSIS — E6609 Other obesity due to excess calories: Secondary | ICD-10-CM | POA: Insufficient documentation

## 2019-07-10 LAB — LIPID PANEL
Cholesterol: 200 mg/dL (ref 0–200)
HDL: 34.6 mg/dL — ABNORMAL LOW (ref >39.00)
Total CHOL/HDL Ratio: 6
Triglycerides: 520 mg/dL — ABNORMAL HIGH (ref 0.0–149.0)

## 2019-07-10 LAB — LDL CHOLESTEROL, DIRECT: Direct LDL: 73 mg/dL

## 2019-07-10 LAB — HEMOGLOBIN A1C: Hgb A1c MFr Bld: 5.9 % (ref 4.6–6.5)

## 2019-07-10 MED ORDER — TRIAMCINOLONE ACETONIDE 0.1 % EX CREA: 1.0000 "application " | TOPICAL_CREAM | Freq: Two times a day (BID) | CUTANEOUS | 0 refills | Status: DC

## 2019-07-10 NOTE — Patient Instructions (Signed)
Great to meet you!  I'm glad you are feeling better from covid.   Labs today to screen for high cholesterol and diabetes. Follow-up will be based on results

## 2019-07-10 NOTE — Progress Notes (Signed)
   Subjective:     Bruce Bailey is a 44 y.o. 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  - will get flares after exposure and then go away - wife may have gotten a prescription for something -    Review of Systems   Social History   Tobacco Use  Smoking Status Never Smoker  Smokeless Tobacco Never Used        Objective:    BP Readings from Last 3 Encounters:  07/10/19 114/78  04/29/19 118/60  04/18/19 131/77   Wt Readings from Last 3 Encounters:  07/10/19 235 lb 12 oz (106.9 kg)  04/29/19 245 lb (111.1 kg)  04/18/19 225 lb (102.1 kg)    BP 114/78   Pulse (!) 52   Temp (!) 97.4 F (36.3 C)   Resp 20   Ht 5\' 8"  (1.727 m)   Wt 235 lb 12 oz (106.9 kg)   SpO2 95%   BMI 35.85 kg/m    Physical Exam Constitutional:      Appearance: Normal appearance. He is not ill-appearing or diaphoretic.  HENT:     Right Ear: External ear normal.     Left Ear: External ear normal.     Nose: Nose normal.  Eyes:     General: No scleral icterus.    Extraocular Movements: Extraocular movements intact.     Conjunctiva/sclera: Conjunctivae normal.  Cardiovascular:     Rate and Rhythm: Normal rate and regular rhythm.     Heart sounds: No murmur.  Pulmonary:     Effort: Pulmonary effort is normal. No respiratory distress.     Breath sounds: Normal breath sounds. No wheezing.  Musculoskeletal:     Cervical back: Neck supple.  Skin:    General: Skin is warm and dry.  Neurological:     Mental Status: He is alert. Mental status is at baseline.  Psychiatric:        Mood and Affect: Mood normal.           Assessment & Plan:   Problem List Items Addressed This Visit      Cardiovascular and Mediastinum   Enlarged RV (right ventricle)    Denies sleepiness, snoring or stopping breathing at night. Not interested in OSA work-up at this time.         Other   Class 2 obesity due to excess calories without  serious comorbidity with body mass index (BMI) of 35.0 to 35.9 in adult    Hx of elevated glucose. Discussed testing for DM and HLD given family history and age. Encouraged walking and healthy diet.       Relevant Orders   Lipid panel   Hemoglobin A1c    Other Visit Diagnoses    Poison ivy dermatitis    -  Primary   Relevant Medications   triamcinolone cream (KENALOG) 0.1 %     Poison ivy - has been using his wife's cream w/ success. Prescribe triamcinolone for prn use for flares. Discussed avoidance including washing clothes, wearing gloves.   Return in about 1 year (around 07/09/2020).  09/08/2020, MD

## 2019-07-10 NOTE — Assessment & Plan Note (Signed)
Hx of elevated glucose. Discussed testing for DM and HLD given family history and age. Encouraged walking and healthy diet.

## 2019-07-10 NOTE — Assessment & Plan Note (Signed)
Denies sleepiness, snoring or stopping breathing at night. Not interested in OSA work-up at this time.

## 2019-08-01 ENCOUNTER — Telehealth: Payer: Self-pay | Admitting: Cardiology

## 2019-08-01 NOTE — Telephone Encounter (Signed)
Spoke with patient today to get his schedule for the GXT that was ordered in March.   Patient refused the text back then due to Covid screening,  wanted to wait til restriction has been lifted.  Mr.  Bruce Bailey don't want to do the test.   Order will be closed.

## 2019-09-30 ENCOUNTER — Other Ambulatory Visit: Payer: Self-pay | Admitting: Primary Care

## 2019-09-30 DIAGNOSIS — R7303 Prediabetes: Secondary | ICD-10-CM

## 2019-09-30 DIAGNOSIS — E785 Hyperlipidemia, unspecified: Secondary | ICD-10-CM

## 2019-10-15 ENCOUNTER — Other Ambulatory Visit: Payer: 59

## 2021-01-13 ENCOUNTER — Ambulatory Visit (INDEPENDENT_AMBULATORY_CARE_PROVIDER_SITE_OTHER): Payer: 59 | Admitting: Nurse Practitioner

## 2021-01-13 ENCOUNTER — Encounter: Payer: Self-pay | Admitting: Nurse Practitioner

## 2021-01-13 ENCOUNTER — Other Ambulatory Visit: Payer: Self-pay

## 2021-01-13 VITALS — BP 120/88 | HR 55 | Temp 98.0°F | Resp 12 | Ht 68.0 in | Wt 230.0 lb

## 2021-01-13 DIAGNOSIS — R351 Nocturia: Secondary | ICD-10-CM

## 2021-01-13 DIAGNOSIS — Z833 Family history of diabetes mellitus: Secondary | ICD-10-CM | POA: Diagnosis not present

## 2021-01-13 DIAGNOSIS — R350 Frequency of micturition: Secondary | ICD-10-CM

## 2021-01-13 LAB — COMPREHENSIVE METABOLIC PANEL
ALT: 26 U/L (ref 0–53)
AST: 20 U/L (ref 0–37)
Albumin: 4.6 g/dL (ref 3.5–5.2)
Alkaline Phosphatase: 52 U/L (ref 39–117)
BUN: 10 mg/dL (ref 6–23)
CO2: 28 mEq/L (ref 19–32)
Calcium: 9.4 mg/dL (ref 8.4–10.5)
Chloride: 104 mEq/L (ref 96–112)
Creatinine, Ser: 1.08 mg/dL (ref 0.40–1.50)
GFR: 83.75 mL/min (ref >60.00)
Glucose, Bld: 101 mg/dL — ABNORMAL HIGH (ref 70–99)
Potassium: 4 mEq/L (ref 3.5–5.1)
Sodium: 140 mEq/L (ref 135–145)
Total Bilirubin: 0.5 mg/dL (ref 0.2–1.2)
Total Protein: 6.9 g/dL (ref 6.0–8.3)

## 2021-01-13 LAB — POCT URINALYSIS DIP (CLINITEK)
Bilirubin, UA: NEGATIVE
Blood, UA: NEGATIVE
Glucose, UA: NEGATIVE mg/dL
Ketones, POC UA: NEGATIVE mg/dL
Leukocytes, UA: NEGATIVE
Nitrite, UA: NEGATIVE
POC PROTEIN,UA: NEGATIVE
Spec Grav, UA: 1.025 (ref 1.010–1.025)
Urobilinogen, UA: 0.2 E.U./dL
pH, UA: 6 (ref 5.0–8.0)

## 2021-01-13 LAB — CBC
HCT: 42.9 % (ref 39.0–52.0)
Hemoglobin: 14.3 g/dL (ref 13.0–17.0)
MCHC: 33.2 g/dL (ref 30.0–36.0)
MCV: 94.3 fl (ref 78.0–100.0)
Platelets: 178 10*3/uL (ref 150.0–400.0)
RBC: 4.55 Mil/uL (ref 4.22–5.81)
RDW: 13.6 % (ref 11.5–15.5)
WBC: 4.2 10*3/uL (ref 4.0–10.5)

## 2021-01-13 LAB — HEMOGLOBIN A1C: Hgb A1c MFr Bld: 5.9 % (ref 4.6–6.5)

## 2021-01-13 LAB — PSA: PSA: 0.42 ng/mL (ref 0.10–4.00)

## 2021-01-13 NOTE — Patient Instructions (Signed)
Nice to see you today Will check some basic labs but exam was good. Will be in touch with the results. Will get you scheduled for a physical

## 2021-01-13 NOTE — Assessment & Plan Note (Signed)
He has intermittent urinary frequency.  He feels like it is based on the amount of fluids he does take in.  Given his story it sounds likely patient's brother just got diagnosed with diabetes and was having a lot of urinary frequency and wanted him to get checked.  UA in office had no abnormal amount of glucose in it.  We will check A1c just to be certain as he was prediabetic in the past he has lost some weight according to patient.  Continue to work on it.  Pending lab results

## 2021-01-13 NOTE — Assessment & Plan Note (Signed)
Brother recently diagnosed with diabetes.  Patient is still in the obese category was prediabetic we will check A1c pending result.

## 2021-01-13 NOTE — Progress Notes (Signed)
Acute Office Visit  Subjective:    Patient ID: Bruce Bailey, male    DOB: Jun 21, 1975, 45 y.o.   MRN: [id number]  Chief Complaint  Patient presents with   Urinary Frequency    Some days uses the bathroom a lot, feels like he drinks a lot of fluids, gets up at night to urinate and that use to not be the problem. He does work long hours and works outside. His brother has similar symptoms and had diabetes and was told to get checked for that.     Patient is in today for Frequent Urination   Symptoms started: unsure. States not in the recent days.  Was talking to his brother and his brother had same symptoms and was recently dx with DM2. Patient also endorses nocturia x1 at night. Not sure of fathers side of family history. No prostate cancers or problems that he is aware of   Past Medical History:  Diagnosis Date   Acute appendicitis without mention of peritonitis 06/21/2012   Cough    Headache    SOB (shortness of breath)     Past Surgical History:  Procedure Laterality Date   APPENDECTOMY     knee surgery x 3      LAPAROSCOPIC APPENDECTOMY N/A 06/21/2012   Procedure: APPENDECTOMY LAPAROSCOPIC;  Surgeon: Robyne Askew, MD;  Location: [facility] OR;  Service: General;  Laterality: N/A;   OTHER SURGICAL HISTORY Bilateral    ACL times 3   VASECTOMY  07/15/2016    Family History  Problem Relation Age of Onset   Diabetes Mother    Diabetes Father    Stroke Father 27   Stroke Maternal Grandmother    Heart attack Maternal Grandmother 55   Heart disease Maternal Uncle     Social History   Socioeconomic History   Marital status: Married    Spouse name: Darl Pikes   Number of children: 3   Years of education: college   Highest education level: Not on file  Occupational History   Not on file  Tobacco Use   Smoking status: Never   Smokeless tobacco: Never  Vaping Use   Vaping Use: Never used  Substance and Sexual Activity   Alcohol use: No   Drug use: No   Sexual activity:  Yes    Birth control/protection: Surgical  Other Topics Concern   Not on file  Social History Narrative   07/10/19   From: [place] originally but came to [facility] for school [employer]   Living: with wife susan (2001) and 2 children   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      Family: 3 children - DJ and Location manager and [place] (out of the house)       Enjoys: work keeps him busy      Exercise: walking daily w/ work, Systems analyst    Diet: waxed and wane, has been better      Safety   Seat belts: Yes    Guns: No   Safe in relationships: Yes    Social Determinants of Corporate investment banker Strain: Not on file  Food Insecurity: Not on file  Transportation Needs: Not on file  Physical Activity: Not on file  Stress: Not on file  Social Connections: Not on file  Intimate Partner Violence: Not on file    Outpatient Medications Prior to Visit  Medication Sig Dispense Refill   triamcinolone cream (KENALOG) 0.1 % Apply 1 application topically  2 (two) times daily. 30 g 0   No facility-administered medications prior to visit.    No Known Allergies  Review of Systems  Constitutional:  Negative for chills and fever.  Respiratory:  Negative for cough and shortness of breath.   Cardiovascular:  Negative for chest pain.  Gastrointestinal:  Negative for diarrhea, nausea and vomiting.  Genitourinary:  Positive for frequency. Negative for dysuria, hematuria, penile discharge, penile pain, penile swelling, scrotal swelling, testicular pain and urgency.       Nocturia x 1  Neurological:  Negative for dizziness, weakness and light-headedness.      Objective:    Physical Exam Vitals and nursing note reviewed. Exam conducted with a chaperone present [facility], CMA).  Constitutional:      Appearance: Normal appearance.  Cardiovascular:     Rate and Rhythm: Regular rhythm. Bradycardia present.     Pulses: Normal pulses.  Pulmonary:     Effort: Pulmonary effort is  normal.     Breath sounds: Normal breath sounds.  Abdominal:     General: Bowel sounds are normal. There is no distension.     Palpations: There is no mass.     Tenderness: There is no abdominal tenderness.  Genitourinary:    Prostate: Normal.  Skin:    General: Skin is warm.  Neurological:     Mental Status: He is alert.  Psychiatric:        Mood and Affect: Mood normal.        Behavior: Behavior normal.        Thought Content: Thought content normal.        Judgment: Judgment normal.    BP 120/88   Pulse (!) 55   Temp 98 F (36.7 C)   Resp 12   Ht 5\' 8"  (1.727 m)   Wt 230 lb (104.3 kg)   SpO2 98%   BMI 34.97 kg/m  Wt Readings from Last 3 Encounters:  01/13/21 230 lb (104.3 kg)  07/10/19 235 lb 12 oz (106.9 kg)  04/29/19 245 lb (111.1 kg)    Health Maintenance Due  Topic Date Due   COVID-19 Vaccine (1) Never done   Hepatitis C Screening  Never done   TETANUS/TDAP  Never done    There are no preventive care reminders to display for this patient.   No results found for: TSH Lab Results  Component Value Date   WBC 6.3 04/17/2019   HGB 13.8 04/17/2019   HCT 41.6 04/17/2019   MCV 94.5 04/17/2019   PLT 222 04/17/2019   Lab Results  Component Value Date   NA 138 04/17/2019   K 4.3 04/17/2019   CO2 26 04/17/2019   GLUCOSE 151 (H) 04/17/2019   BUN 12 04/17/2019   CREATININE 1.13 04/17/2019   BILITOT 1.2 06/21/2012   ALKPHOS 62 06/21/2012   AST 47 (H) 06/21/2012   ALT 41 06/21/2012   PROT 7.7 06/21/2012   ALBUMIN 4.5 06/21/2012   CALCIUM 9.4 04/17/2019   ANIONGAP 10 04/17/2019   Lab Results  Component Value Date   CHOL 200 07/10/2019   Lab Results  Component Value Date   HDL 34.60 (L) 07/10/2019   No results found for: [facility] Lab Results  Component Value Date   TRIG (H) 07/10/2019    520.0 Triglyceride is over 400; calculations on Lipids are invalid.   Lab Results  Component Value Date   CHOLHDL 6 07/10/2019   Lab Results  Component  Value Date   HGBA1C  5.9 07/10/2019       Assessment & Plan:   Problem List Items Addressed This Visit       Other   Urinary frequency - Primary    He has intermittent urinary frequency.  He feels like it is based on the amount of fluids he does take in.  Given his story it sounds likely patient's brother just got diagnosed with diabetes and was having a lot of urinary frequency and wanted him to get checked.  UA in office had no abnormal amount of glucose in it.  We will check [id number] just to be certain as he was prediabetic in the past he has lost some weight according to patient.  Continue to work on it.  Pending lab results      Relevant Orders   POCT URINALYSIS DIP (CLINITEK) (Completed)   Hemoglobin A1c   Nocturia    Likely related to age and prostate.  States just now recently having relationship with his father so unsure about prostate cancers and issues.  Prostate exam within normal limits on exam today we will check PSA pending lab results.      Relevant Orders   PSA   Family history of diabetes mellitus    Brother recently diagnosed with diabetes.  Patient is still in the obese category was prediabetic we will check A1c pending result.      Relevant Orders   CBC   Comprehensive metabolic panel   Hemoglobin A1c     No orders of the defined types were placed in this encounter.  This visit occurred during the SARS-CoV-2 public health emergency.  Safety protocols were in place, including screening questions prior to the visit, additional usage of staff PPE, and extensive cleaning of exam room while observing appropriate contact time as indicated for disinfecting solutions.   Romilda Garret, NP

## 2021-01-13 NOTE — Assessment & Plan Note (Signed)
Likely related to age and prostate.  States just now recently having relationship with his father so unsure about prostate cancers and issues.  Prostate exam within normal limits on exam today we will check PSA pending lab results.

## 2021-03-20 IMAGING — CR DG CHEST 2V
1 series · 2 of 2 positions shown · non-contrast
Comparison: 04/20/2016

CLINICAL DATA: Productive cough for 3 weeks

EXAM:
CHEST - 2 VIEW

[Series 1: dg chest 2 view · 0.14mm/px · 2 of 2 slices shown]
[im 1/2]
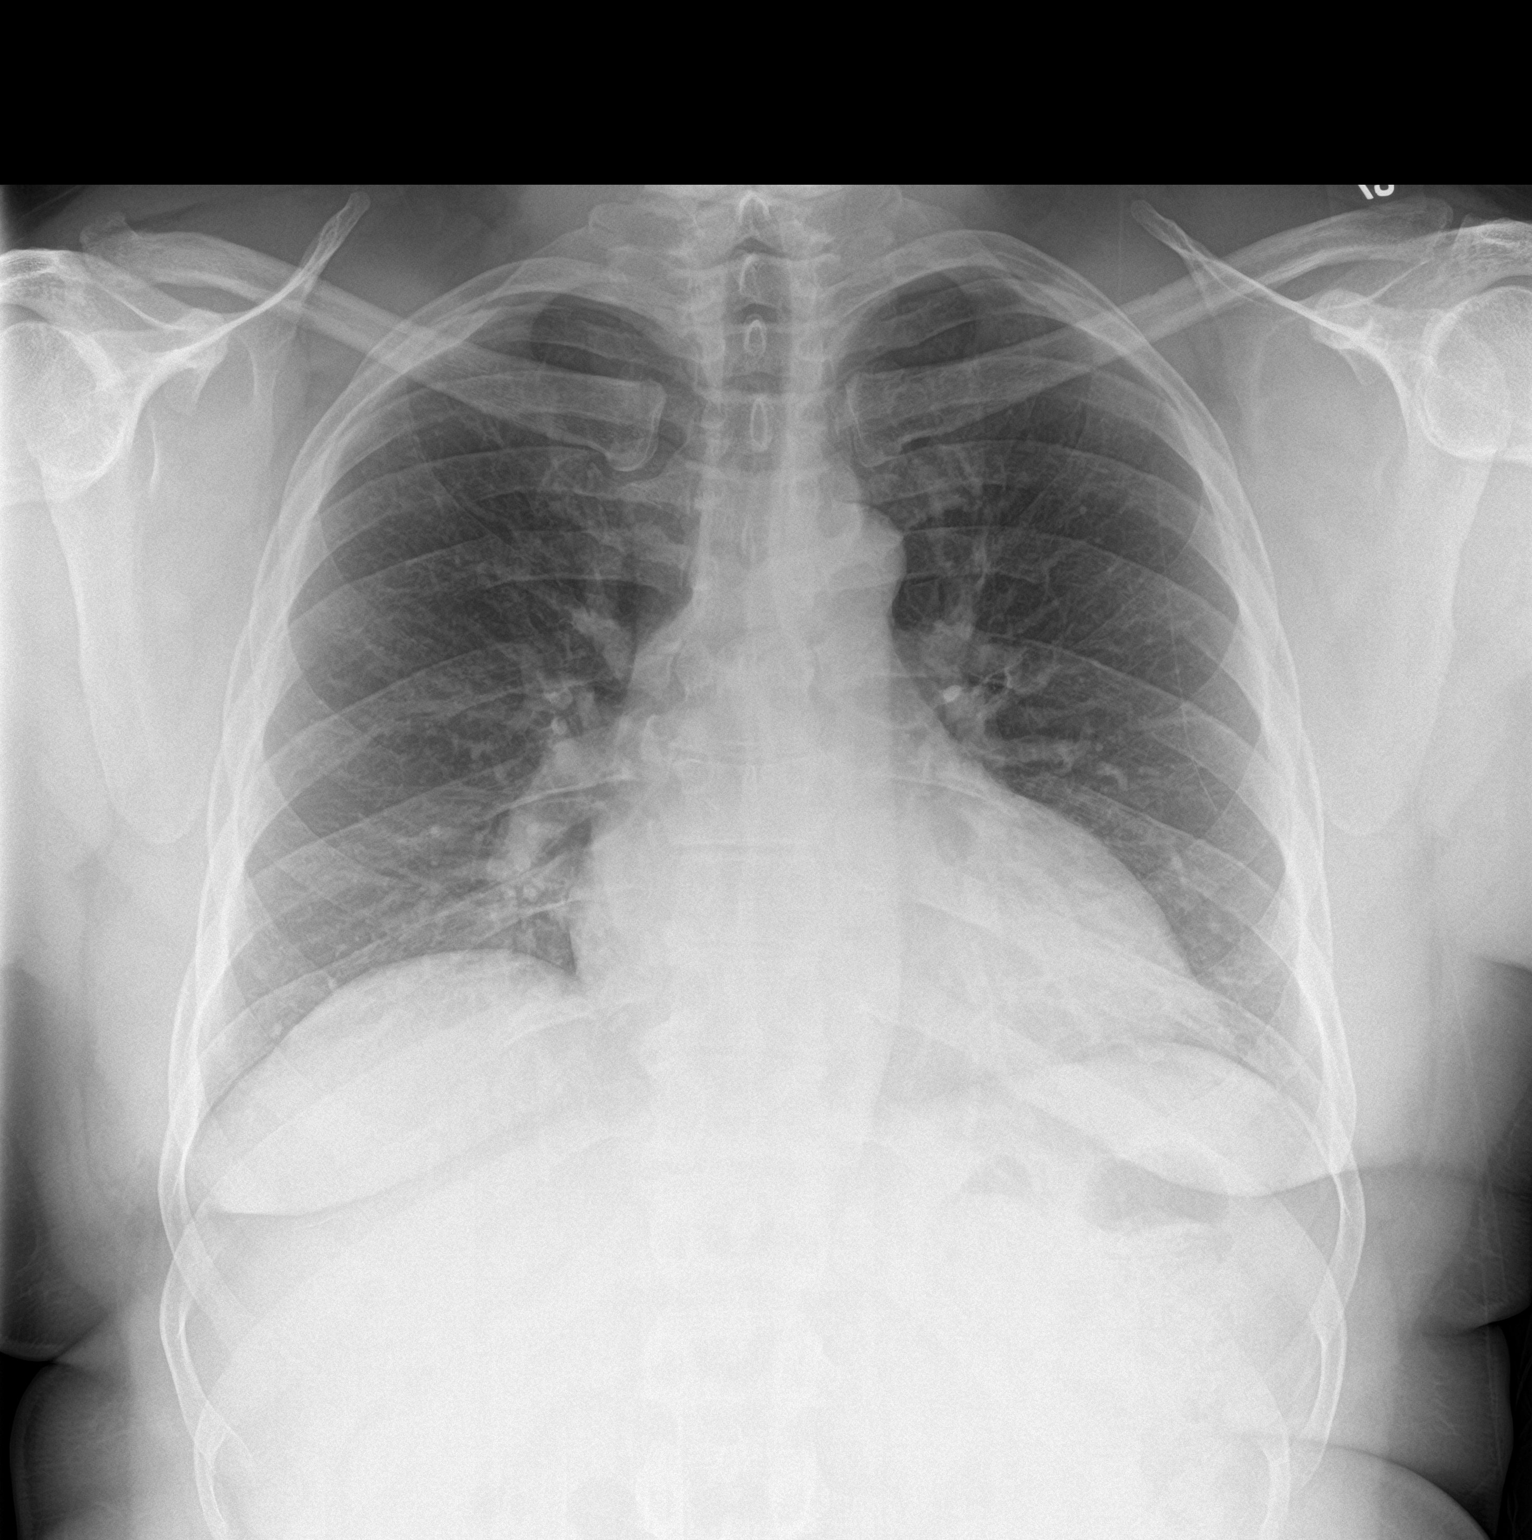
[im 2/2]
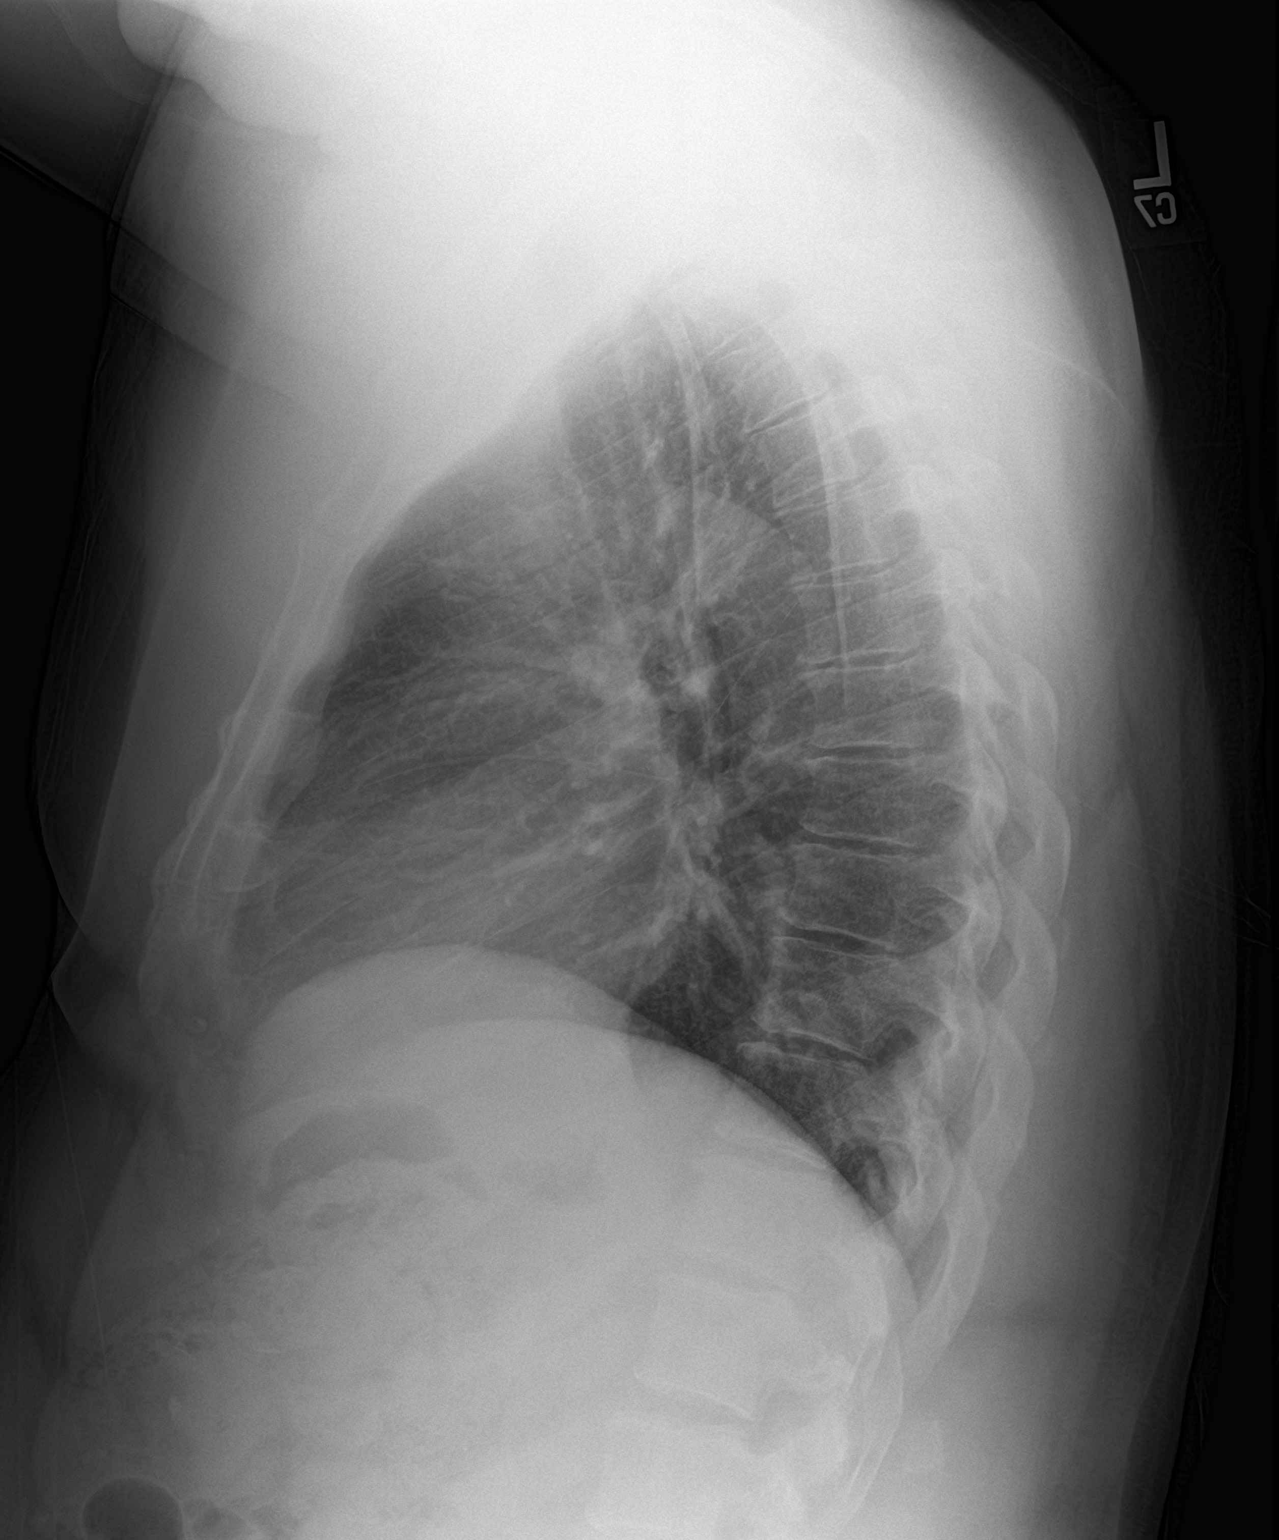

[2 of 2 positions shown; findings below may reference images not displayed]

FINDINGS: Borderline to mild cardiomegaly. Both lungs are clear. The
visualized skeletal structures are unremarkable.
IMPRESSION: No active cardiopulmonary disease.

## 2021-10-24 ENCOUNTER — Ambulatory Visit
Admission: EM | Admit: 2021-10-24 | Discharge: 2021-10-24 | Disposition: A | Discharge status: Home / Self Care | Payer: 59 | Attending: Emergency Medicine | Admitting: Emergency Medicine

## 2021-10-24 DIAGNOSIS — L237 Allergic contact dermatitis due to plants, except food: Secondary | ICD-10-CM | POA: Diagnosis not present

## 2021-10-24 MED ORDER — PREDNISONE 10 MG (21) PO TBPK: ORAL_TABLET | Freq: Every day | ORAL | 0 refills | Status: DC

## 2021-10-24 MED ORDER — CETIRIZINE HCL 10 MG PO TABS: 10.0000 mg | ORAL_TABLET | Freq: Every day | ORAL | 0 refills | Status: DC

## 2021-10-24 MED ORDER — DEXAMETHASONE SODIUM PHOSPHATE 10 MG/ML IJ SOLN
10.0000 mg | Freq: Once | INTRAMUSCULAR | Status: AC
  Administered 2021-10-24: 10 mg via INTRAMUSCULAR

## 2021-10-24 NOTE — Discharge Instructions (Addendum)
You were given an injection of a steroid called dexamethasone.  Start the prednisone taper tomorrow as directed.    Take Benadryl or Zyrtec as directed.    Follow up with your primary care provider.   

## 2021-10-24 NOTE — ED Provider Notes (Signed)
Bruce Bailey    CSN: [id number] Arrival date & time: 10/24/21  1102      History   Chief Complaint Chief Complaint  Patient presents with   Rash    HPI Bruce Bailey is a 46 y.o. male.  Patient presents with pruritic rash on his hands, arms, neck x1 day.  His rash started after he was working in the yard and came in contact with poison oak.  Treatment at home with topical anti-itch cream.  No fever, chills, sore throat, cough, shortness of breath, or other symptoms.  The history is provided by the patient and medical records.    Past Medical History:  Diagnosis Date   Acute appendicitis without mention of peritonitis 06/21/2012   Cough    Headache    SOB (shortness of breath)     Patient Active Problem List   Diagnosis Date Noted   Urinary frequency 01/13/2021   Nocturia 01/13/2021   Family history of diabetes mellitus 01/13/2021   Class 2 obesity due to excess calories without serious comorbidity with body mass index (BMI) of 35.0 to 35.9 in adult 07/10/2019   Enlarged RV (right ventricle) 07/10/2019    Past Surgical History:  Procedure Laterality Date   APPENDECTOMY     knee surgery x 3      LAPAROSCOPIC APPENDECTOMY N/A 06/21/2012   Procedure: APPENDECTOMY LAPAROSCOPIC;  Surgeon: Robyne Askew, MD;  Location: [facility] OR;  Service: General;  Laterality: N/A;   OTHER SURGICAL HISTORY Bilateral    ACL times 3   VASECTOMY  07/15/2016       Home Medications    Prior to Admission medications   Medication Sig Start Date End Date Taking? Authorizing Provider  cetirizine (ZYRTEC ALLERGY) 10 MG tablet Take 1 tablet (10 mg total) by mouth daily for 7 days. 10/24/21 10/31/21 Yes Mickie Bail, NP  predniSONE (STERAPRED UNI-PAK 21 TAB) 10 MG (21) TBPK tablet Take by mouth daily. As directed 10/25/21  Yes Mickie Bail, NP    Family History Family History  Problem Relation Age of Onset   Diabetes Mother    Diabetes Father    Stroke Father 79   Stroke Maternal  Grandmother    Heart attack Maternal Grandmother 55   Heart disease Maternal Uncle     Social History Social History   Tobacco Use   Smoking status: Never   Smokeless tobacco: Never  Vaping Use   Vaping Use: Never used  Substance Use Topics   Alcohol use: No   Drug use: No     Allergies   Patient has no known allergies.   Review of Systems Review of Systems  Constitutional:  Negative for chills and fever.  HENT:  Negative for ear pain and sore throat.   Respiratory:  Negative for cough and shortness of breath.   Gastrointestinal:  Negative for diarrhea and vomiting.  Skin:  Positive for rash. Negative for color change.  All other systems reviewed and are negative.    Physical Exam Triage Vital Signs ED Triage Vitals  Enc Vitals Group     BP 10/24/21 1209 128/82     Pulse Rate 10/24/21 1209 (!) 49     Resp 10/24/21 1209 18     Temp 10/24/21 1209 97.9 F (36.6 C)     Temp src --      SpO2 10/24/21 1209 97 %     Weight 10/24/21 1211 225 lb (102.1 kg)     Height  10/24/21 1211 5\' 8"  (1.727 m)     Head Circumference --      Peak Flow --      Pain Score 10/24/21 1211 0     Pain Loc --      Pain Edu? --      Excl. in GC? --    No data found.  Updated Vital Signs BP 128/82   Pulse (!) 49   Temp 97.9 F (36.6 C)   Resp 18   Ht 5\' 8"  (1.727 m)   Wt 225 lb (102.1 kg)   SpO2 97%   BMI 34.21 kg/m   Visual Acuity Right Eye Distance:   Left Eye Distance:   Bilateral Distance:    Right Eye Near:   Left Eye Near:    Bilateral Near:     Physical Exam Vitals and nursing note reviewed.  Constitutional:      General: He is not in acute distress.    Appearance: Normal appearance. He is well-developed. He is not ill-appearing.  HENT:     Mouth/Throat:     Mouth: Mucous membranes are moist.  Cardiovascular:     Rate and Rhythm: Normal rate and regular rhythm.  Pulmonary:     Effort: Pulmonary effort is normal. No respiratory distress.  Musculoskeletal:      Cervical back: Neck supple.  Skin:    General: Skin is warm and dry.     Findings: Rash present.     Comments: Flesh-colored patchy papular rash on neck, hands, and arms.  No drainage or erythema.  Neurological:     Mental Status: He is alert.  Psychiatric:        Mood and Affect: Mood normal.        Behavior: Behavior normal.      UC Treatments / Results  Labs (all labs ordered are listed, but only abnormal results are displayed) Labs Reviewed - No data to display  EKG   Radiology No results found.  Procedures Procedures (including critical care time)  Medications Ordered in UC Medications  dexamethasone (DECADRON) injection 10 mg (10 mg Intramuscular Given 10/24/21 1310)    Initial Impression / Assessment and Plan / UC Course  I have reviewed the triage vital signs and the nursing notes.  Pertinent labs & imaging results that were available during my care of the patient were reviewed by me and considered in my medical decision making (see chart for details).   Poison oak dermatitis.  Dexamethasone given here; starting prednisone taper tomorrow.  Also treating with Zyrtec.  Education provided on poison oak dermatitis.  Instructed patient to follow-up with his PCP if his symptoms are not improving.  He agrees to plan of care.      Final Clinical Impressions(s) / UC Diagnoses   Final diagnoses:  Poison oak dermatitis     Discharge Instructions      You were given an injection of a steroid called dexamethasone.  Start the prednisone taper tomorrow as directed.    Take Benadryl or Zyrtec as directed.    Follow up with your primary care provider.        ED Prescriptions     Medication Sig Dispense Auth. Provider   predniSONE (STERAPRED UNI-PAK 21 TAB) 10 MG (21) TBPK tablet Take by mouth daily. As directed 21 tablet , NP   cetirizine (ZYRTEC ALLERGY) 10 MG tablet Take 1 tablet (10 mg total) by mouth daily for 7 days. 7 tablet 10/26/21,  NP      PDMP not reviewed this encounter.   Mickie Bail, NP 10/24/21 1316

## 2021-10-24 NOTE — ED Triage Notes (Signed)
Patient to Urgent Care with complaints of rash present to entire body. Reports being exposed to poison oak yesterday.

## 2022-01-05 ENCOUNTER — Observation Stay (HOSPITAL_COMMUNITY): Payer: 59

## 2022-01-05 ENCOUNTER — Observation Stay (HOSPITAL_COMMUNITY)
Admission: EM | Admit: 2022-01-05 | Discharge: 2022-01-06 | Disposition: A | Discharge status: Home / Self Care | Payer: 59 | Attending: Family Medicine | Admitting: Family Medicine

## 2022-01-05 ENCOUNTER — Emergency Department (HOSPITAL_COMMUNITY): Payer: 59

## 2022-01-05 ENCOUNTER — Encounter (HOSPITAL_COMMUNITY): Payer: Self-pay

## 2022-01-05 DIAGNOSIS — R81 Glycosuria: Secondary | ICD-10-CM | POA: Diagnosis not present

## 2022-01-05 DIAGNOSIS — R001 Bradycardia, unspecified: Secondary | ICD-10-CM

## 2022-01-05 DIAGNOSIS — R55 Syncope and collapse: Principal | ICD-10-CM

## 2022-01-05 DIAGNOSIS — Z8616 Personal history of COVID-19: Secondary | ICD-10-CM | POA: Insufficient documentation

## 2022-01-05 DIAGNOSIS — I517 Cardiomegaly: Secondary | ICD-10-CM

## 2022-01-05 DIAGNOSIS — Z6835 Body mass index (BMI) 35.0-35.9, adult: Secondary | ICD-10-CM

## 2022-01-05 DIAGNOSIS — E669 Obesity, unspecified: Secondary | ICD-10-CM | POA: Insufficient documentation

## 2022-01-05 DIAGNOSIS — R400 Somnolence: Secondary | ICD-10-CM | POA: Diagnosis present

## 2022-01-05 DIAGNOSIS — E6609 Other obesity due to excess calories: Secondary | ICD-10-CM

## 2022-01-05 DIAGNOSIS — E66812 Obesity, class 2: Secondary | ICD-10-CM

## 2022-01-05 LAB — COMPREHENSIVE METABOLIC PANEL
ALT: 40 U/L (ref 0–44)
AST: 34 U/L (ref 15–41)
Albumin: 4.8 g/dL (ref 3.5–5.0)
Alkaline Phosphatase: 52 U/L (ref 38–126)
Anion gap: 8 (ref 5–15)
BUN: 14 mg/dL (ref 6–20)
CO2: 27 mmol/L (ref 22–32)
Calcium: 9.2 mg/dL (ref 8.9–10.3)
Chloride: 107 mmol/L (ref 98–111)
Creatinine, Ser: 1.07 mg/dL (ref 0.61–1.24)
GFR, Estimated: >60 mL/min (ref >60)
Glucose, Bld: 158 mg/dL — ABNORMAL HIGH (ref 70–99)
Potassium: 4.2 mmol/L (ref 3.5–5.1)
Sodium: 142 mmol/L (ref 135–145)
Total Bilirubin: 0.8 mg/dL (ref 0.3–1.2)
Total Protein: 7.6 g/dL (ref 6.5–8.1)

## 2022-01-05 LAB — URINALYSIS, ROUTINE W REFLEX MICROSCOPIC
Bilirubin Urine: NEGATIVE
Glucose, UA: 50 mg/dL — AB
Hgb urine dipstick: NEGATIVE
Ketones, ur: NEGATIVE mg/dL
Leukocytes,Ua: NEGATIVE
Nitrite: NEGATIVE
Protein, ur: NEGATIVE mg/dL
Specific Gravity, Urine: 1.027 (ref 1.005–1.030)
pH: 5 (ref 5.0–8.0)

## 2022-01-05 LAB — CBC WITH DIFFERENTIAL/PLATELET
Abs Immature Granulocytes: 0.05 10*3/uL (ref 0.00–0.07)
Basophils Absolute: 0 10*3/uL (ref 0.0–0.1)
Basophils Relative: 0 %
Eosinophils Absolute: 0 10*3/uL (ref 0.0–0.5)
Eosinophils Relative: 0 %
HCT: 44 % (ref 39.0–52.0)
Hemoglobin: 14.2 g/dL (ref 13.0–17.0)
Immature Granulocytes: 1 %
Lymphocytes Relative: 16 %
Lymphs Abs: 1.5 10*3/uL (ref 0.7–4.0)
MCH: 31.4 pg (ref 26.0–34.0)
MCHC: 32.3 g/dL (ref 30.0–36.0)
MCV: 97.3 fL (ref 80.0–100.0)
Monocytes Absolute: 0.5 10*3/uL (ref 0.1–1.0)
Monocytes Relative: 5 %
Neutro Abs: 7.3 10*3/uL (ref 1.7–7.7)
Neutrophils Relative %: 78 %
Platelets: 193 10*3/uL (ref 150–400)
RBC: 4.52 MIL/uL (ref 4.22–5.81)
RDW: 12.9 % (ref 11.5–15.5)
WBC: 9.4 10*3/uL (ref 4.0–10.5)
nRBC: 0 % (ref 0.0–0.2)

## 2022-01-05 LAB — TROPONIN I (HIGH SENSITIVITY)
Troponin I (High Sensitivity): 3 ng/L (ref <18)
Troponin I (High Sensitivity): 3 ng/L (ref <18)

## 2022-01-05 LAB — CBG MONITORING, ED: Glucose-Capillary: 155 mg/dL — ABNORMAL HIGH (ref 70–99)

## 2022-01-05 MED ORDER — ONDANSETRON HCL 4 MG PO TABS: 4.0000 mg | ORAL_TABLET | Freq: Four times a day (QID) | ORAL | Status: DC | PRN

## 2022-01-05 MED ORDER — LACTATED RINGERS IV BOLUS
1000.0000 mL | Freq: Once | INTRAVENOUS | Status: AC
  Administered 2022-01-05: 1000 mL via INTRAVENOUS

## 2022-01-05 MED ORDER — SODIUM CHLORIDE 0.9% FLUSH: 3.0000 mL | Freq: Two times a day (BID) | INTRAVENOUS | Status: DC

## 2022-01-05 MED ORDER — ENOXAPARIN SODIUM 40 MG/0.4ML IJ SOSY
40.0000 mg | PREFILLED_SYRINGE | INTRAMUSCULAR | Status: DC
  Administered 2022-01-05: 40 mg via SUBCUTANEOUS
  Filled 2022-01-05: qty 0.4

## 2022-01-05 MED ORDER — ACETAMINOPHEN 325 MG PO TABS: 650.0000 mg | ORAL_TABLET | Freq: Four times a day (QID) | ORAL | Status: DC | PRN

## 2022-01-05 MED ORDER — ONDANSETRON HCL 4 MG/2ML IJ SOLN: 4.0000 mg | Freq: Four times a day (QID) | INTRAMUSCULAR | Status: DC | PRN

## 2022-01-05 MED ORDER — ACETAMINOPHEN 650 MG RE SUPP: 650.0000 mg | Freq: Four times a day (QID) | RECTAL | Status: DC | PRN

## 2022-01-05 MED ORDER — ONDANSETRON 4 MG PO TBDP
4.0000 mg | ORAL_TABLET | Freq: Once | ORAL | Status: AC
Start: 2022-01-05 — End: 2022-01-05
  Administered 2022-01-05: 4 mg via ORAL
  Filled 2022-01-05: qty 1

## 2022-01-05 NOTE — ED Triage Notes (Signed)
Pt presents with c/o being drowsy upon EMS arrival. Pt had pinpoint pupils on EMS arrival, Narcan given with no change in symptoms. Pt has no hx of opioid abuse. Pt reports that he feels like his eyes are rolling back in his head and he can tell that it is happening. Pt has no hx of seizures. Pt's HR between 42-60 per EMS.

## 2022-01-05 NOTE — H&P (Signed)
History and Physical    Patient: Bruce Bailey [id number] DOB: 10-11-1975 DOA: 01/05/2022 DOS: the patient was seen and examined on 01/05/2022 PCP: Waunita Schooner, MD  Patient coming from: Home  Chief Complaint:  Chief Complaint  Patient presents with   Drowsy   HPI: Bruce Bailey is a 46 y.o. male with medical history significant of cardiomegaly, HLD, obesity.  Cardiomegaly noted in 2021 on CXR, felt to reflect pericardial fat pad, pt asymptomatic except for chronic cough at that time.  Today pt in to ED following syncope episode.  Associated with pre-syncopal symptoms.  No response to narcan, no h/o opiate abuse.  Associated dizziness.  Resting HR 50s back in 2021.  Today HR 40 and s. Bruce Bailey.   Review of Systems: As mentioned in the history of present illness. All other systems reviewed and are negative. Past Medical History:  Diagnosis Date   Acute appendicitis without mention of peritonitis 06/21/2012   Cough    Headache    SOB (shortness of breath)    Past Surgical History:  Procedure Laterality Date   APPENDECTOMY     knee surgery x 3      LAPAROSCOPIC APPENDECTOMY N/A 06/21/2012   Procedure: APPENDECTOMY LAPAROSCOPIC;  Surgeon: Merrie Roof, MD;  Location: [place];  Service: General;  Laterality: N/A;   OTHER SURGICAL HISTORY Bilateral    ACL times 3   VASECTOMY  07/15/2016   Social History:  reports that he has never smoked. He has never used smokeless tobacco. He reports that he does not drink alcohol and does not use drugs.  No Known Allergies  Family History  Problem Relation Age of Onset   Diabetes Mother    Diabetes Father    Stroke Father 43   Stroke Maternal Grandmother    Heart attack Maternal Grandmother 55   Heart disease Maternal Uncle     Prior to Admission medications   Medication Sig Start Date End Date Taking? Authorizing Provider  cetirizine (ZYRTEC ALLERGY) 10 MG tablet Take 1 tablet (10 mg total) by mouth daily for 7 days. 10/24/21 10/31/21   Sharion Balloon, NP  predniSONE (STERAPRED UNI-PAK 21 TAB) 10 MG (21) TBPK tablet Take by mouth daily. As directed 10/25/21   Sharion Balloon, NP    Physical Exam: Vitals:   01/05/22 1800 01/05/22 1815 01/05/22 1847 01/05/22 1915  BP: (!) 129/92   130/78  Pulse: (!) 48 (!) 49  (!) 57  Resp: 15 15  (!) 21  Temp:   (!) 97.4 F (36.3 C)   TempSrc:   Oral   SpO2: 99% 100%  100%  Weight:      Height:       Constitutional: NAD, calm, comfortable Eyes: PERRL, lids and conjunctivae normal ENMT: Mucous membranes are moist. Posterior pharynx clear of any exudate or lesions.Normal dentition.  Neck: normal, supple, no masses, no thyromegaly Respiratory: clear to auscultation bilaterally, no wheezing, no crackles. Normal respiratory effort. No accessory muscle use.  Cardiovascular: Bradycardic Abdomen: no tenderness, no masses palpated. No hepatosplenomegaly. Bowel sounds positive.  Musculoskeletal: no clubbing / cyanosis. No joint deformity upper and lower extremities. Good ROM, no contractures. Normal muscle tone.  Skin: no rashes, lesions, ulcers. No induration Neurologic: CN 2-12 grossly intact. Sensation intact, DTR normal. Strength 5/5 in all 4.  Psychiatric: Normal judgment and insight. Alert and oriented x 3. Normal mood.   Data Reviewed:    EKG = S. Brady with HR 40.  Previously noted S1QT3  present in 2021 no longer present on todays EKG.  Trop neg x2  CMP shows BGL of 150  UA with 50 glucose in urine.  Assessment and Plan: * Syncope ? Symptomatic bradycardia given s.brady in the 40s. Syncope pathway Tele monitor 2d echo Not on any med that can cause bradycardia (actually not on any chronic meds at all period). Cards consulted, they will see tomorrow, they state okay to leave at [facility] for the moment as long as no high grade AV block on EKGs.  Glucosuria Question undiagnosed DM2 given BGL 150 today and minimal glucosuria? Check A1C CBG checks AC/HS  Enlarged RV (right  ventricle) Per 2021 note: Noted on ECHO 2021. Normal heart function. Reassuring PFTs.  Class 2 obesity due to excess calories without serious comorbidity with body mass index (BMI) of 35.0 to 35.9 in adult Seeing if he also has DM2 by checking A1C, if so consider ozempic vs mounjaro as initial outpt treatment if insurance will cover.      Advance Care Planning:   Code Status: Full Code  Consults: EDP spoke with cardiology, they will see in consult tomorrow; okay to leave at [facility] for the moment they state  Family Communication: Family at bedside  Severity of Illness: The appropriate patient status for this patient is OBSERVATION. Observation status is judged to be reasonable and necessary in order to provide the required intensity of service to ensure the patient's safety. The patient's presenting symptoms, physical exam findings, and initial radiographic and laboratory data in the context of their medical condition is felt to place them at decreased risk for further clinical deterioration. Furthermore, it is anticipated that the patient will be medically stable for discharge from the hospital within 2 midnights of admission.   Author: Hillary Bow., DO 01/05/2022 7:33 PM  For on call review [email or web address].

## 2022-01-05 NOTE — Assessment & Plan Note (Addendum)
Associated bradycardia and myopia. Cardiology consulted. Patient noted to be in sinus rhythm. Transthoracic Echocardiogram ordered. After cardiology evaluation and further history, likely etiology for symptoms is possible organophosphate poisoning. Transthoracic Echocardiogram canceled by cardiology and precautions shared with patient.

## 2022-01-05 NOTE — ED Notes (Signed)
Pt ambulated in the room. O2 stayed steady at 95.

## 2022-01-05 NOTE — ED Provider Triage Note (Signed)
Emergency Medicine Provider Triage Evaluation Note  Bruce Bailey , a 46 y.o. male  was evaluated in triage.  Pt complains of lightheaded episode today.  Patient reports that he was on the phone with his brother eating a cold piece of pizza when he suddenly felt lightheaded.  Denies any chest pain or shortness of breath.  He did call his wife.  Wife reports that his speech was normal however his voice sounded hoarse.  Patient was confused whenever the paramedic arrived.  He denies any syncope.  Patient reports that he still feels lightheaded now, but better than he did before.  He did have an episode of emesis while here.  I had not worsening by position change.  Review of Systems  Positive:  Negative:  Physical Exam  BP 112/69 (BP Location: Right Arm)   Pulse (!) 45   Temp (!) 97.5 F (36.4 C) (Oral)   Resp 12   Ht 5\' 8"  (1.727 m)   Wt 104.3 kg   SpO2 94%   BMI 34.97 kg/m  Gen:   Awake, no distress   Resp:  Normal effort  MSK:   Moves extremities without difficulty  Other:  Bradycardia.  Cranial nerves II through XII are grossly intact.  No pronator drift.  Patient is answering questions appropriately with appropriate speech.  Strength is intact grossly in patient's upper and lower bilateral extremities.                                     Medical Decision Making  Medically screening exam initiated at 3:33 PM.  Appropriate orders placed.  Bruce Bailey was informed that the remainder of the evaluation will be completed by another provider, this initial triage assessment does not replace that evaluation, and the importance of remaining in the ED until their evaluation is complete.  Attending assessed in triage and agrees with ordered labs.    Sherwood Gambler, PA-C 01/05/22 1615

## 2022-01-05 NOTE — ED Provider Notes (Signed)
[facility] DEPT Provider Note   CSN: [id number] Arrival date & time: 01/05/22  1445     History  Chief Complaint  Patient presents with   Drowsy    Bruce Bailey is a 46 y.o. male.  HPI     46 y.o. male with medical history significant of cardiomegaly, HLD, obesity comes into the emergency room with chief complaint of drowsiness.  History is also provided by patient's wife.  According the patient, he had lunch around 1 PM and then started feeling unwell.  He started feeling dizzy, unsteady.  He denies any chest pain, shortness of breath, palpitations.  The wife indicates that she discovered patient holding his head and telling her that he had a headache.  Thereafter patient started sweating and was noted by the wife going in and out of consciousness.  The wife states that she told him that she was going to call 911, patient does not recall having that conversation and was surprised when paramedics were in the house.   Patient has been diagnosed with cardiomegaly post COVID.  Home Medications Prior to Admission medications   Medication Sig Start Date End Date Taking? Authorizing Provider  cetirizine (ZYRTEC ALLERGY) 10 MG tablet Take 1 tablet (10 mg total) by mouth daily for 7 days. Patient not taking: Reported on 01/05/2022 10/24/21 10/31/21  Sharion Balloon, NP      Allergies    Patient has no known allergies.    Review of Systems   Review of Systems  All other systems reviewed and are negative.   Physical Exam Updated Vital Signs BP (!) 144/90 (BP Location: Right Arm)   Pulse (!) 58   Temp 98.1 F (36.7 C) (Oral)   Resp 18   Ht 5\' 8"  (1.727 m)   Wt 104.3 kg   SpO2 92%   BMI 34.97 kg/m  Physical Exam Vitals and nursing note reviewed.  Constitutional:      Appearance: He is well-developed.  HENT:     Head: Atraumatic.  Eyes:     Extraocular Movements: Extraocular movements intact.     Pupils: Pupils are equal, round, and  reactive to light.  Cardiovascular:     Rate and Rhythm: Normal rate.  Pulmonary:     Effort: Pulmonary effort is normal.  Musculoskeletal:     Cervical back: Neck supple.  Skin:    General: Skin is warm.  Neurological:     Mental Status: He is alert and oriented to person, place, and time.     Cranial Nerves: No cranial nerve deficit.     Sensory: No sensory deficit.     Motor: No weakness.     Coordination: Coordination normal.     ED Results / Procedures / Treatments   Labs (all labs ordered are listed, but only abnormal results are displayed) Labs Reviewed  COMPREHENSIVE METABOLIC PANEL - Abnormal; Notable for the following components:      Result Value   Glucose, Bld 158 (*)    All other components within normal limits  URINALYSIS, ROUTINE W REFLEX MICROSCOPIC - Abnormal; Notable for the following components:   Glucose, UA 50 (*)    All other components within normal limits  CBG MONITORING, ED - Abnormal; Notable for the following components:   Glucose-Capillary 155 (*)    All other components within normal limits  CBC WITH DIFFERENTIAL/PLATELET  HIV ANTIBODY (ROUTINE TESTING W REFLEX)  HEMOGLOBIN A1C  TROPONIN I (HIGH SENSITIVITY)  TROPONIN I (HIGH SENSITIVITY)  EKG EKG Interpretation  Date/Time:  Wednesday January 05 2022 16:06:47 EST Ventricular Rate:  40 PR Interval:  171 QRS Duration: 108 QT Interval:  483 QTC Calculation: 394 R Axis:   6 Text Interpretation: Sinus bradycardia Abnormal R-wave progression, early transition Left ventricular hypertrophy Borderline ST elevation, lateral leads No acute changes No old tracing to compare Confirmed by Derwood Kaplan [phone]) on 01/05/2022 6:54:27 PM  Radiology X-ray chest PA and lateral  Result Date: 01/05/2022 CLINICAL DATA:  Lightheaded dizzy EXAM: CHEST - 2 VIEW COMPARISON:  06/23/2019 FINDINGS: The heart size and mediastinal contours are within normal limits. Both lungs are clear. The visualized skeletal  structures are unremarkable. IMPRESSION: No active cardiopulmonary disease. Electronically Signed   By: Jasmine Pang M.D.   On: 01/05/2022 20:32   CT HEAD WO CONTRAST ( )  Result Date: 01/05/2022 CLINICAL DATA:  TIA EXAM: CT HEAD WITHOUT CONTRAST TECHNIQUE: Contiguous axial images were obtained from the base of the skull through the vertex without intravenous contrast. RADIATION DOSE REDUCTION: This exam was performed according to the departmental dose-optimization program which includes automated exposure control, adjustment of the mA and/or kV according to patient size and/or use of iterative reconstruction technique. COMPARISON:  None Available. FINDINGS: Brain: No evidence of acute infarction, hemorrhage, hydrocephalus, extra-axial collection or mass lesion/mass effect. Vascular: Negative for hyperdense vessel Skull: Negative Sinuses/Orbits: Negative Other: None IMPRESSION: Negative CT head. Electronically Signed   By: Marlan Palau M.D.   On: 01/05/2022 16:25    Procedures .Critical Care  Performed by: Derwood Kaplan, MD Authorized by: Derwood Kaplan, MD   Critical care provider statement:    Critical care time (minutes):  30   Critical care was necessary to treat or prevent imminent or life-threatening deterioration of the following conditions:  Circulatory failure   Critical care was time spent personally by me on the following activities:  Development of treatment plan with patient or surrogate, discussions with consultants, evaluation of patient's response to treatment, examination of patient, ordering and review of laboratory studies, ordering and review of radiographic studies, ordering and performing treatments and interventions, pulse oximetry, re-evaluation of patient's condition, review of old charts and obtaining history from patient or surrogate     Medications Ordered in ED Medications  sodium chloride flush (NS) 0.9 % injection 3 mL (3 mLs Intravenous Not Given 01/05/22  2256)  enoxaparin (LOVENOX) injection 40 mg (40 mg Subcutaneous Given 01/05/22 2319)  acetaminophen (TYLENOL) tablet 650 mg (has no administration in time range)    Or  acetaminophen (TYLENOL) suppository 650 mg (has no administration in time range)  ondansetron (ZOFRAN) tablet 4 mg (has no administration in time range)    Or  ondansetron (ZOFRAN) injection 4 mg (has no administration in time range)  ondansetron (ZOFRAN-ODT) disintegrating tablet 4 mg (4 mg Oral Given 01/05/22 1601)  lactated ringers bolus 1,000 mL (0 mLs Intravenous Stopped 01/05/22 1920)    ED Course/ Medical Decision Making/ A&P                           Medical Decision Making Problems Addressed: Sinus bradycardia: acute illness or injury with systemic symptoms Syncope, unspecified syncope type: undiagnosed new problem with uncertain prognosis  Amount and/or Complexity of Data Reviewed Radiology: ordered.  Risk Decision regarding hospitalization.  This patient presents to the ED with chief complaint(s) of syncope, drowsiness with pertinent past medical history of cardiomegaly post COVID-19.patient denies any history of substance use disorder, heavy  stimulant use, family history of young people with cardiac disease or sudden cardiac deaths.The complaint involves an extensive differential diagnosis and also carries with it a high risk of complications and morbidity.    The differential diagnosis includes symptomatic bradycardia, arrhythmia, posterior circulation compromise, posterior stroke, toxicity from substance use.  The initial plan is to get basic labs and place patient on telemetry.  Additional history obtained: Additional history obtained from spouse Records reviewed  previous cardiology notes, echocardiogram  Independent labs interpretation:  The following labs were independently interpreted: CBC, BMP is normal.  Independent visualization and interpretation of imaging: - I independently visualized the  following imaging with scope of interpretation limited to determining acute life threatening conditions related to emergency care: X-ray of the chest, which revealed no evidence of pulmonary edema  Treatment and Reassessment: Patient reassessed. He feels a lot better now.  He still feels a little drowsy, not 100% back to baseline.  His resting heart rate has been noted as low as 40. Case will be discussed with cardiology.  Consultation: - Consulted or discussed management/test interpretation with external professional: Cardiology service.  Dr. Nechama Guard recommends that patient be admitted to the hospital for symptomatic bradycardia.  Patient can stay at [facility].  At this time, with neuro exam is completely nonfocal, patient has no neuro risk factors.  I do not think he had a posterior stroke.  If patient's symptoms do not improve, then the admitting team can consider getting MRI brain.    Final Clinical Impression(s) / ED Diagnoses Final diagnoses:  Syncope, unspecified syncope type  Sinus bradycardia  Bradycardia    Rx / DC Orders ED Discharge Orders     None         Varney Biles, MD 01/05/22 2321

## 2022-01-05 NOTE — Assessment & Plan Note (Addendum)
Per 2021 note: Noted on ECHO 2021. Normal heart function. Reassuring PFTs.

## 2022-01-05 NOTE — Assessment & Plan Note (Signed)
Question undiagnosed DM2 given BGL 150 today and minimal glucosuria? Check A1C CBG checks AC/HS

## 2022-01-05 NOTE — Assessment & Plan Note (Addendum)
Seeing if he also has DM2 by checking A1C, if so consider ozempic vs mounjaro as initial outpt treatment if insurance will cover.

## 2022-01-06 ENCOUNTER — Other Ambulatory Visit (HOSPITAL_COMMUNITY): Payer: 59

## 2022-01-06 DIAGNOSIS — H5703 Miosis: Secondary | ICD-10-CM | POA: Diagnosis not present

## 2022-01-06 DIAGNOSIS — E6609 Other obesity due to excess calories: Secondary | ICD-10-CM

## 2022-01-06 DIAGNOSIS — R001 Bradycardia, unspecified: Secondary | ICD-10-CM

## 2022-01-06 DIAGNOSIS — T600X1A Toxic effect of organophosphate and carbamate insecticides, accidental (unintentional), initial encounter: Secondary | ICD-10-CM

## 2022-01-06 DIAGNOSIS — I517 Cardiomegaly: Secondary | ICD-10-CM | POA: Diagnosis not present

## 2022-01-06 DIAGNOSIS — R55 Syncope and collapse: Secondary | ICD-10-CM | POA: Diagnosis not present

## 2022-01-06 LAB — LIPID PANEL
Cholesterol: 185 mg/dL (ref 0–200)
HDL: 47 mg/dL
LDL Cholesterol: 126 mg/dL — ABNORMAL HIGH (ref 0–99)
Total CHOL/HDL Ratio: 3.9 ratio
Triglycerides: 60 mg/dL
VLDL: 12 mg/dL (ref 0–40)

## 2022-01-06 LAB — BLOOD GAS, VENOUS
Acid-Base Excess: 3 mmol/L — ABNORMAL HIGH (ref 0.0–2.0)
Bicarbonate: 29 mmol/L — ABNORMAL HIGH (ref 20.0–28.0)
O2 Saturation: 78.6 %
Patient temperature: 37
pCO2, Ven: 49 mmHg (ref 44–60)
pH, Ven: 7.38 (ref 7.25–7.43)
pO2, Ven: 45 mmHg (ref 32–45)

## 2022-01-06 LAB — CBG MONITORING, ED
Glucose-Capillary: 110 mg/dL — ABNORMAL HIGH (ref 70–99)
Glucose-Capillary: 142 mg/dL — ABNORMAL HIGH (ref 70–99)
Glucose-Capillary: 158 mg/dL — ABNORMAL HIGH (ref 70–99)

## 2022-01-06 LAB — HIV ANTIBODY (ROUTINE TESTING W REFLEX): HIV Screen 4th Generation wRfx: NONREACTIVE

## 2022-01-06 LAB — HEMOGLOBIN A1C
Hgb A1c MFr Bld: 5.6 % (ref 4.8–5.6)
Mean Plasma Glucose: 114.02 mg/dL

## 2022-01-06 LAB — MAGNESIUM: Magnesium: 2 mg/dL (ref 1.7–2.4)

## 2022-01-06 LAB — TSH: TSH: 0.584 u[IU]/mL (ref 0.350–4.500)

## 2022-01-06 NOTE — Discharge Instructions (Signed)
Bruce Bailey,  You were in the hospital with probable organophosphate poisoning from your work. Please ensure you protect yourself against further exposures.

## 2022-01-06 NOTE — Consult Note (Addendum)
Cardiology Consultation   Patient ID: Bruce Bailey MRN: [id number]; DOB: 12-25-1975  Admit date: 01/05/2022 Date of Consult: 01/06/2022  PCP:  Bruce Dimitri, MD   [facility] Providers Cardiologist:  None   {  Patient Profile:   Bruce Bailey is a 46 y.o. male with a history of mildly enlarged RV noted on Echo in 2021 following COVID infection and morbid obesity who is being seen 01/06/2022 for the evaluation of syncope at the request of Bruce Bailey.  History of Present Illness:   Bruce Bailey is a 45 year old male with the above history who is followed by Bruce Bailey.  Patient was referred to Bruce Bailey in 04/2019 for further evaluation of cardio megaly noted on the recent chest x-ray for evaluation of chronic shortness of breath.  At that visit, he actually denies shortness of breath but did report a occasional pressure over his left breast that was nonexertional and resolved with deep breathing and drinking water.  Echo was ordered to assess for LV size and function.  His pain was felt to be atypical and likely due to GERD but ETT was ordered to rule out ischemia.  Echo showed LVEF of 55-60% with no regional wall motion abnormalities, mild LVH, and normal diastolic parameters as well as a mildly enlarged RV with normal RV systolic function.  This sleep study, PFTs with DLCO, and VQ scan to rule out chronic thromboembolic disease was ordered for further LV evaluation of enlarged RV.  VQ scan and PFTs were normal. It does not look like patient ever completed the home sleep study. He also declined the ETT because he did not want to be tested for COVID prior to this and wanted to wait until the pandemic restrictions were lifted. However, he was loss to follow-up and has not been seen since.  Patient presented to the ED on 01/05/2022 for further evaluation of drowsiness and syncope.  Of note, he was noted to have pinpoint pupils on EMS arrival.  He was given Narcan with no change in symptoms.   On arrival to the ED, he is bradycardic but otherwise vital signs stable. EKG showed sinus bradycardia, rate 40 bpm, with LVH with slight ST elevation in leads I and aVL that do not meet criteria for STEMI as well as some nonspecific T wave changes.  High-sensitivity troponin negative x2.  This x-ray showed no acute findings.  Head CT and brain MRI were negative. WBC 9.4, Hgb 14.2, Plts 193. Na 142, K 4.2, Glucose 158, BUN 14, Cr 1.07. LFTs negative. Urinalysis negative. He was admitted for further evaluation of syncope and concern for symptomatic bradycardia. Cardiology consulted for further evaluation.  At the time of this evaluation, patient is resting comfortably in no acute distress. Currently in sinus bradycardia with rates in the high 50s to 60s. Patient states he was in his usual state of health until yesterday. He ate a couple of pieces of pizza when then he just started to feel "off" like something was not right. He states it felt like he was "going in and out" of sleep but states he never fully loss consciousness. His wife wife was with him at the time and was concerned because he was not responding to questions appropriately. She knew something was not right so she called EMS. He has never had this happen before. He own a pressure washing/ landscaping business and he states about 6 weeks ago he had a very brief episode of lightheadedness/dizziness that felt  like the "world was spinning" while at work but it only lasted about a minute. He denies any other episodes of lightheadedness, dizziness, or syncope. No palpitations, chest pain, shortness of breath, orthopnea, PND, or lower extremity edema. He describes some pain in his left foot near the heal first thing in the morning that improves throughout the day and sounds like plantar fasciitis. He also reports occasional leg cramps which sounds like may be due to dehydration. He works very long hours with his company. He denies any recent fevers or  illness. He does reported vomiting twice after eating since arriving in the ED but no other GI symptoms. No abnormal bleeding in urine or stools.   He states he is feeling better than when he came in but still feels very tired.  He denies any tobacco, alcohol, or drug use (including prescription pain medications). He states addiction runs in his family so he is very careful not to take anything that he could become addicted to. He reports his maternal grandmother died from a heart attack but no other known cardiovascular disease.  Past Medical History:  Diagnosis Date   Acute appendicitis without mention of peritonitis 06/21/2012   Cough    Headache    SOB (shortness of breath)     Past Surgical History:  Procedure Laterality Date   APPENDECTOMY     knee surgery x 3      LAPAROSCOPIC APPENDECTOMY N/A 06/21/2012   Procedure: APPENDECTOMY LAPAROSCOPIC;  Surgeon: Robyne Askew, MD;  Location: [facility] OR;  Service: General;  Laterality: N/A;   OTHER SURGICAL HISTORY Bilateral    ACL times 3   VASECTOMY  07/15/2016     Home Medications:  Prior to Admission medications   Medication Sig Start Date End Date Taking? Authorizing Provider  cetirizine (ZYRTEC ALLERGY) 10 MG tablet Take 1 tablet (10 mg total) by mouth daily for 7 days. Patient not taking: Reported on 01/05/2022 10/24/21 10/31/21  Bruce Bail, NP    Inpatient Medications: Scheduled Meds:  enoxaparin (LOVENOX) injection  40 mg Subcutaneous Q24H   sodium chloride flush  3 mL Intravenous Q12H   Continuous Infusions:  PRN Meds: acetaminophen **OR** acetaminophen, ondansetron **OR** ondansetron (ZOFRAN) IV  Allergies:   No Known Allergies  Social History:   Social History   Socioeconomic History   Marital status: Married    Spouse name: Bruce Bailey   Number of children: 3   Years of education: college   Highest education level: Not on file  Occupational History   Not on file  Tobacco Use   Smoking status: Never   Smokeless  tobacco: Never  Vaping Use   Vaping Use: Never used  Substance and Sexual Activity   Alcohol use: No   Drug use: No   Sexual activity: Yes    Birth control/protection: Surgical  Other Topics Concern   Not on file  Social History Narrative   07/10/19   From: [place] originally but came to [facility] for school [employer]   Living: with wife susan (2001) and 2 children   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      Family: 3 children - DJ and Location manager and [place] (out of the house)       Enjoys: work keeps him busy      Exercise: walking daily w/ work, golf    Diet: waxed and wane, has been better      Safety   Seat belts:  Yes    Guns: No   Safe in relationships: Yes    Social Determinants of Health   Financial Resource Strain: Not on file  Food Insecurity: No Food Insecurity (01/05/2022)   Hunger Vital Sign    Worried About Running Out of Food in the Last Year: Never true    Ran Out of Food in the Last Year: Never true  Transportation Needs: No Transportation Needs (01/05/2022)   PRAPARE - Administrator, Civil ServiceTransportation    Lack of Transportation (Medical): No    Lack of Transportation (Non-Medical): No  Physical Activity: Not on file  Stress: Not on file  Social Connections: Not on file  Intimate Partner Violence: Not on file    Family History:   Family History  Problem Relation Age of Onset   Diabetes Mother    Diabetes Father    Stroke Father [id number]   Stroke Maternal Grandmother    Heart attack Maternal Grandmother 55   Heart disease Maternal Uncle      ROS:  Please see the history of present illness.  Review of Systems  Constitutional:  Positive for malaise/fatigue. Negative for fever.  HENT:  Negative for congestion.   Respiratory:  Negative for cough and shortness of breath.   Cardiovascular:  Negative for chest pain, palpitations, orthopnea, leg swelling and PND.  Gastrointestinal:  Positive for vomiting. Negative for blood in stool, diarrhea and melena.   Genitourinary:  Negative for hematuria.  Musculoskeletal:  Negative for falls.       Pain around left heal in the morning  Neurological:  Positive for loss of consciousness. Negative for dizziness.  Endo/Heme/Allergies:  Does not bruise/bleed easily.  Psychiatric/Behavioral:  Negative for substance abuse.     All other ROS reviewed and negative.     Physical Exam/Data:   Vitals:   01/06/22 0530 01/06/22 0600 01/06/22 0630 01/06/22 0700  BP: 120/68 110/68 98/73 135/82  Pulse: 63 (!) 53 (!) 51 (!) 51  Resp: 16 13 14 14   Temp:  98.2 F (36.8 C)    TempSrc:  Oral    SpO2: 95% 97% 96% 92%  Weight:      Height:       No intake or output data in the 24 hours ending 01/06/22 0720    01/05/2022    2:51 PM 10/24/2021   12:11 PM 01/13/2021   12:17 PM  Last 3 Weights  Weight (lbs) 230 lb 225 lb 230 lb  Weight (kg) 104.327 kg 102.059 kg 104.327 kg     Body mass index is 34.97 kg/m.  General: 46 y.o. African-American male resting comfortably in no acute distress. HEENT: Normocephalic and atraumatic. Sclera clear.  Neck: Supple. No carotid bruits. No JVD. Heart: Mildly bradycardic with normal rhythm. Distinct S1 and S2. No murmurs, gallops, or rubs. Radial and distal pedal pulses 2+ and equal bilaterally. Lungs: No increased work of breathing. Clear to ausculation bilaterally. No wheezes, rhonchi, or rales.  Abdomen: Soft, non-distended, and non-tender to palpation. Bowel sounds present. Extremities: No lower extremity edema.    Skin: Warm and dry. Neuro: Alert and oriented x3. No focal deficits. Psych: Normal affect. Responds appropriately.   EKG:  The EKG was personally reviewed and demonstrates:  Sinus bradycardia, rate 48 bpm, with LVH with slight ST elevation in leads I and aVL that do not meet criteria for STEMI as well as some nonspecific T wave changes.  Telemetry:  Telemetry was personally reviewed and demonstrates:  Sinus rhythm with rates mostly in the  high 40s to low  60s. No evidence of any AV block and significant pauses.  Relevant CV Studies:  Echocardiogram 05/28/2019: Impressions:  1. Left ventricular ejection fraction, by estimation, is 55 to 60%. The  left ventricle has normal function. The left ventricle has no regional  wall motion abnormalities. There is mild left ventricular hypertrophy.  Left ventricular diastolic parameters  were normal. The average left ventricular global longitudinal strain is  -19.6 %.   2. Right ventricular systolic function is normal. The right ventricular  size is mildly enlarged. There is normal pulmonary artery systolic  pressure. The estimated right ventricular systolic pressure is 22.0 mmHg.   3. The mitral valve is normal in structure. Trivial mitral valve  regurgitation.   4. The aortic valve is normal in structure. Aortic valve regurgitation is  not visualized. No aortic stenosis is present.   5. The inferior vena cava is normal in size with greater than 50%  respiratory variability, suggesting right atrial pressure of 3 mmHg.    Laboratory Data:  High Sensitivity Troponin:   Recent Labs  Lab 01/05/22 1552 01/05/22 1752  TROPONINIHS 3 3     Chemistry Recent Labs  Lab 01/05/22 1552  NA 142  K 4.2  CL 107  CO2 27  GLUCOSE 158*  BUN 14  CREATININE 1.07  CALCIUM 9.2  GFRNONAA >60  ANIONGAP 8    Recent Labs  Lab 01/05/22 1552  PROT 7.6  ALBUMIN 4.8  AST 34  ALT 40  ALKPHOS 52  BILITOT 0.8   Lipids No results for input(s): "CHOL", "TRIG", "HDL", "LABVLDL", "LDLCALC", "CHOLHDL" in the last 168 hours.  Hematology Recent Labs  Lab 01/05/22 1552  WBC 9.4  RBC 4.52  HGB 14.2  HCT 44.0  MCV 97.3  MCH 31.4  MCHC 32.3  RDW 12.9  PLT 193   Thyroid No results for input(s): "TSH", "FREET4" in the last 168 hours.  BNPNo results for input(s): "BNP", "PROBNP" in the last 168 hours.  DDimer No results for input(s): "DDIMER" in the last 168 hours.   Radiology/Studies:  MR BRAIN WO  CONTRAST  Result Date: 01/05/2022 CLINICAL DATA:  Dizziness; syncopal episode EXAM: MRI HEAD WITHOUT CONTRAST TECHNIQUE: Multiplanar, multiecho pulse sequences of the brain and surrounding structures were obtained without intravenous contrast. COMPARISON:  No prior MRI, correlation is made with CT head 01/05/2022 FINDINGS: Brain: No restricted diffusion to suggest acute or subacute infarct. No acute hemorrhage, mass, mass effect, or midline shift. No hydrocephalus or extra-axial collection. Hemosiderin deposition to suggest remote hemorrhage. Vascular: Normal arterial flow voids. Skull and upper cervical spine: Normal marrow signal. Sinuses/Orbits: Clear paranasal sinuses. The orbits are unremarkable. Other: The mastoids are well aerated. IMPRESSION: No acute intracranial process. No etiology is seen for the patient's dizziness. Electronically Signed   By: Wiliam Ke M.D.   On: 01/05/2022 23:26   X-ray chest PA and lateral  Result Date: 01/05/2022 CLINICAL DATA:  Lightheaded dizzy EXAM: CHEST - 2 VIEW COMPARISON:  06/23/2019 FINDINGS: The heart size and mediastinal contours are within normal limits. Both lungs are clear. The visualized skeletal structures are unremarkable. IMPRESSION: No active cardiopulmonary disease. Electronically Signed   By: Jasmine Pang M.D.   On: 01/05/2022 20:32   CT HEAD WO CONTRAST ( )  Result Date: 01/05/2022 CLINICAL DATA:  TIA EXAM: CT HEAD WITHOUT CONTRAST TECHNIQUE: Contiguous axial images were obtained from the base of the skull through the vertex without intravenous contrast. RADIATION DOSE REDUCTION: This exam was  performed according to the departmental dose-optimization program which includes automated exposure control, adjustment of the mA and/or kV according to patient size and/or use of iterative reconstruction technique. COMPARISON:  None Available. FINDINGS: Brain: No evidence of acute infarction, hemorrhage, hydrocephalus, extra-axial collection or mass  lesion/mass effect. Vascular: Negative for hyperdense vessel Skull: Negative Sinuses/Orbits: Negative Other: None IMPRESSION: Negative CT head. Electronically Signed   By: Marlan Palau M.D.   On: 01/05/2022 16:25     Assessment and Plan:   Near Syncope Patient was admitted for possible syncope. Patient states it felt like he was going "in and out of sleep" but denies any true LOC. He denies any other symptoms at the time. When EMS arrived, he was intermittently bradycardic with rates ranging from the 40s to the 60s and he was noted to have pinpoint pupils. He was given Narcan with no improvement (patient denies any drug use). EKG showed sinus bradycardia, rate 40 bpm, with non-specific ST/T changes. High-sensitivity troponin negative. Telemetry shows sinus rhythm with rates ranging mostly from the high 40s to low 60s. Orthostatics were negative. Patient states he just feels very tired right now. Will check TSH. Echo is pending. Assuming this is normal, suspect will just arrange outpatient monitor on discharge.   Sinus Bradycardia He has a chronic bradycardia with rates often in the low 50s at outpatient visits. EKG on admission did shows sinus bradycardia with rate of 40 bpm. No evidence of any AV block or significant pauses to explain possible syncopal episode. Telemetry shows rates mostly in the high 40s to low 60s. Potassium is 4.2. Will check Magnesium and TSH. He is not on any AV nodal agents at home (in fact he does not take any chronic medications at all). I do not think heart rates in the 40s would have caused these episodes given baseline heart rates are typically in the 50s and his BP was stable. Recommend outpatient monitor at discharge. Patient's BMI is 34. He denies any snoring or daytime somnolence and states he feels well rested in the morning. However, still may benefit from a sleep study.  RV Enlargement Echo in 04/2019 showed normal LV function with mild RV enlargement. VQ scan at that  time was negative for PE and PFTs were normal. Sleep study was also recommended at that time but patient never had this done. Euvolemic on exam. No signs or symptoms of CHF. Will reassess on Echo here.   Risk Assessment/Risk Scores:  = N/A  For questions or updates, please contact [facility] Please consult [email or web address] for contact info under    Signed, Corrin Parker, PA-C  01/06/2022 7:20 AM

## 2022-01-06 NOTE — Discharge Summary (Signed)
Physician Discharge Summary   Patient: Bruce Bailey MRN: [id number] DOB: 24-Jun-1975  Admit date:     01/05/2022  Discharge date: 01/06/22  Discharge Physician: Jacquelin Hawking, MD   PCP: Gweneth Dimitri, MD   Recommendations at discharge:  PCP follow-up Repeat urinalysis  Discharge Diagnoses: Principal Problem:   Near syncope Active Problems:   Enlarged RV (right ventricle)   Glucosuria   Class 2 obesity due to excess calories without serious comorbidity with body mass index (BMI) of 35.0 to 35.9 in adult   Sinus bradycardia  Resolved Problems:   * No resolved hospital problems. *  Hospital Course:  * Near syncope Associated bradycardia and myopia. Cardiology consulted. Patient noted to be in sinus rhythm. Transthoracic Echocardiogram ordered. After cardiology evaluation and further history, likely etiology for symptoms is possible organophosphate poisoning. Transthoracic Echocardiogram canceled by cardiology and precautions shared with patient.  Glucosuria Mild. Hemoglobin A1C of 5.6%. No proteinuria. Can repeat urinalysis as an outpatient.  Enlarged RV (right ventricle) Noted.  Obesity (BMI 30-39.9) Estimated body mass index is 34.97 kg/m as calculated from the following:   Height as of this encounter: 5\' 8"  (1.727 m).   Weight as of this encounter: 104.3 kg.   Consultants: Cardiology Procedures performed: None  Disposition: Home Diet recommendation: Regular diet  DISCHARGE MEDICATION: Allergies as of 01/06/2022   No Known Allergies      Medication List     STOP taking these medications    cetirizine 10 MG tablet Commonly known as: ZyrTEC Allergy        Discharge Exam: BP 129/81 (BP Location: Right Arm)   Pulse (!) 54   Temp 98 F (36.7 C) (Oral)   Resp 19   Ht 5\' 8"  (1.727 m)   Wt 104.3 kg   SpO2 99%   BMI 34.97 kg/m   General exam: Appears calm and comfortable Respiratory system: Clear to auscultation. Respiratory effort  normal. Cardiovascular system: S1 & S2 heard, RRR. No murmurs, rubs, gallops or clicks. Gastrointestinal system: Abdomen is nondistended, soft and nontender. No organomegaly or masses felt. Normal bowel sounds heard. Central nervous system: Alert and oriented. No focal neurological deficits. Musculoskeletal: No edema. No calf tenderness Skin: No cyanosis. No rashes Psychiatry: Judgement and insight appear normal. Mood & affect appropriate.   Condition at discharge: stable  The results of significant diagnostics from this hospitalization (including imaging, microbiology, ancillary and laboratory) are listed below for reference.   Imaging Studies: MR BRAIN WO CONTRAST  Result Date: 01/05/2022 CLINICAL DATA:  Dizziness; syncopal episode EXAM: MRI HEAD WITHOUT CONTRAST TECHNIQUE: Multiplanar, multiecho pulse sequences of the brain and surrounding structures were obtained without intravenous contrast. COMPARISON:  No prior MRI, correlation is made with CT head 01/05/2022 FINDINGS: Brain: No restricted diffusion to suggest acute or subacute infarct. No acute hemorrhage, mass, mass effect, or midline shift. No hydrocephalus or extra-axial collection. Hemosiderin deposition to suggest remote hemorrhage. Vascular: Normal arterial flow voids. Skull and upper cervical spine: Normal marrow signal. Sinuses/Orbits: Clear paranasal sinuses. The orbits are unremarkable. Other: The mastoids are well aerated. IMPRESSION: No acute intracranial process. No etiology is seen for the patient's dizziness. Electronically Signed   By: 13/09/2021 M.D.   On: 01/05/2022 23:26   X-ray chest PA and lateral  Result Date: 01/05/2022 CLINICAL DATA:  Lightheaded dizzy EXAM: CHEST - 2 VIEW COMPARISON:  06/23/2019 FINDINGS: The heart size and mediastinal contours are within normal limits. Both lungs are clear. The visualized skeletal structures are unremarkable. IMPRESSION:  No active cardiopulmonary disease. Electronically Signed    By: Jasmine Pang M.D.   On: 01/05/2022 20:32   CT HEAD WO CONTRAST ( )  Result Date: 01/05/2022 CLINICAL DATA:  TIA EXAM: CT HEAD WITHOUT CONTRAST TECHNIQUE: Contiguous axial images were obtained from the base of the skull through the vertex without intravenous contrast. RADIATION DOSE REDUCTION: This exam was performed according to the departmental dose-optimization program which includes automated exposure control, adjustment of the mA and/or kV according to patient size and/or use of iterative reconstruction technique. COMPARISON:  None Available. FINDINGS: Brain: No evidence of acute infarction, hemorrhage, hydrocephalus, extra-axial collection or mass lesion/mass effect. Vascular: Negative for hyperdense vessel Skull: Negative Sinuses/Orbits: Negative Other: None IMPRESSION: Negative CT head. Electronically Signed   By: Marlan Palau M.D.   On: 01/05/2022 16:25    Microbiology: Results for orders placed or performed during the hospital encounter of 06/20/19  SARS CORONAVIRUS 2 (TAT 6-24 HRS) Nasopharyngeal Nasopharyngeal Swab     Status: None   Collection Time: 06/20/19  2:50 PM   Specimen: Nasopharyngeal Swab  Result Value Ref Range Status   SARS Coronavirus 2 NEGATIVE NEGATIVE Final    Comment: (NOTE) SARS-CoV-2 target nucleic acids are NOT DETECTED. The SARS-CoV-2 RNA is generally detectable in upper and lower respiratory specimens during the acute phase of infection. Negative results do not preclude SARS-CoV-2 infection, do not rule out co-infections with other pathogens, and should not be used as the sole basis for treatment or other patient management decisions. Negative results must be combined with clinical observations, patient history, and epidemiological information. The expected result is Negative. Fact Sheet for Patients: HairSlick.no Fact Sheet for Healthcare Providers: quierodirigir.com This test is not yet  approved or cleared by the [place] FDA and  has been authorized for detection and/or diagnosis of SARS-CoV-2 by FDA under an Emergency Use Authorization (EUA). This EUA will remain  in effect (meaning this test can be used) for the duration of the COVID-19 declaration under Section 56 4(b)(1) of the Act, 21 U.S.C. section 360bbb-3(b)(1), unless the authorization is terminated or revoked sooner. Performed at [facility], [address] [place], [place] [address]     Labs: CBC: Recent Labs  Lab 01/05/22 1552  WBC 9.4  NEUTROABS 7.3  HGB 14.2  HCT 44.0  MCV 97.3  PLT 193   Basic Metabolic Panel: Recent Labs  Lab 01/05/22 1552  NA 142  K 4.2  CL 107  CO2 27  GLUCOSE 158*  BUN 14  CREATININE 1.07  CALCIUM 9.2   Liver Function Tests: Recent Labs  Lab 01/05/22 1552  AST 34  ALT 40  ALKPHOS 52  BILITOT 0.8  PROT 7.6  ALBUMIN 4.8   CBG: Recent Labs  Lab 01/05/22 2151 01/06/22 0557 01/06/22 0738  GLUCAP 155* 110* 142*    Discharge time spent: 35 minutes.  Signed: Jacquelin Hawking, MD [facility] 01/06/2022

## 2022-01-10 ENCOUNTER — Telehealth: Payer: Self-pay

## 2022-01-10 NOTE — Telephone Encounter (Signed)
Transition Care Management Follow-up Telephone Call Date of discharge and from where: TCM [facility] 01-06-22 Dx: syncope How have you been since you were released from the hospital? Feeling better  Any questions or concerns? No  Items Reviewed: Did the pt receive and understand the discharge instructions provided? Yes  Medications obtained and verified? Yes  Other? No  Any new allergies since your discharge? No  Dietary orders reviewed? Yes Do you have support at home? Yes   Home Care and Equipment/Supplies: Were home health services ordered? no If so, what is the name of the agency? na  Has the agency set up a time to come to the patient's home? not applicable Were any new equipment or medical supplies ordered?  No What is the name of the medical supply agency? na Were you able to get the supplies/equipment? not applicable Do you have any questions related to the use of the equipment or supplies? No  Functional Questionnaire: (I = Independent and D = Dependent) ADLs: I  Bathing/Dressing- I  Meal Prep- I  Eating- I  Maintaining continence- I  Transferring/Ambulation- I  Managing Meds- I  Follow up appointments reviewed:  PCP Hospital f/u appt confirmed? Yes  Scheduled to see Mort Sawyers FNP on 01-18-22 @ 1040AM. Specialist [facility] f/u appt confirmed? No  . Are transportation arrangements needed? No  If their condition worsens, is the pt aware to call PCP or go to the Emergency Dept.? Yes Was the patient provided with contact information for the PCP's office or ED? Yes Was to pt encouraged to call back with questions or concerns? Yes   Woodfin Ganja LPN [facility] Nurse Health Advisor Direct Dial [phone]

## 2022-01-18 ENCOUNTER — Inpatient Hospital Stay: Payer: 59 | Admitting: Family

## 2022-12-15 ENCOUNTER — Encounter (HOSPITAL_BASED_OUTPATIENT_CLINIC_OR_DEPARTMENT_OTHER): Payer: Self-pay | Admitting: Emergency Medicine

## 2022-12-15 ENCOUNTER — Emergency Department (HOSPITAL_BASED_OUTPATIENT_CLINIC_OR_DEPARTMENT_OTHER): Payer: 59 | Admitting: Radiology

## 2022-12-15 ENCOUNTER — Emergency Department (HOSPITAL_BASED_OUTPATIENT_CLINIC_OR_DEPARTMENT_OTHER)
Admission: EM | Admit: 2022-12-15 | Discharge: 2022-12-15 | Disposition: A | Discharge status: Home / Self Care | Payer: 59 | Attending: Emergency Medicine | Admitting: Emergency Medicine

## 2022-12-15 ENCOUNTER — Other Ambulatory Visit: Payer: Self-pay

## 2022-12-15 DIAGNOSIS — G8929 Other chronic pain: Secondary | ICD-10-CM | POA: Insufficient documentation

## 2022-12-15 DIAGNOSIS — M25561 Pain in right knee: Secondary | ICD-10-CM | POA: Insufficient documentation

## 2022-12-15 NOTE — Discharge Instructions (Addendum)
As discussed, please follow-up with your orthopedic office as soon as possible for further evaluation.  They would be the ones to order further imaging if indicated.  Avoid movements and activities that are painful. Elevate the area of injury and wrap with an elastic bandage or tape to help with swelling.  Avoid using anti-inflammatories (NSAIDs) like ibuprofen (Motrin) and naproxen (Aleve) for pain. You may use tylenol as needed (up to 1000mg  every 6 hours).    Return the ER for any fevers, redness of the knee, any other new or concerning symptoms.

## 2022-12-15 NOTE — ED Triage Notes (Signed)
Pt via pov from home with right knee pain x 2 months, worsening yesterday. He reports that he was unable to walk yesterday. Pt has had prior MCL injuries and has had surgery, states he needs an MRI. Pt alert & oriented, nad noted.

## 2022-12-15 NOTE — ED Provider Notes (Signed)
[place] EMERGENCY DEPARTMENT AT [facility] Provider Note   CSN: [id number] Arrival date & time: 12/15/22  1409     History  Chief Complaint  Patient presents with   Knee Pain    Bruce Bailey is a 47 y.o. male with no significant past medical history presents with concern for pain along the medial side of his right knee.  This is been ongoing for the past 2 months or so.  The pain came on gradually and he was evaluated at an orthopedic office and told to wear a brace.  This did help and the pain resolved.  Yesterday, he had to walk 2 miles which is more activity than normal for him.  Reports his pain flared back up.  He presents requesting MRI to evaluate his MCL.  Denies any fever or chills, any acute injury to the knee.   Knee Pain      Home Medications Prior to Admission medications   Not on File      Allergies    Patient has no known allergies.    Review of Systems   Review of Systems  Musculoskeletal:        Right knee pain    Physical Exam Updated Vital Signs BP 131/84   Pulse (!) 59   Temp 98 F (36.7 C)   Resp 14   Ht 5\' 8"  (1.727 m)   Wt 104.3 kg   SpO2 100%   BMI 34.97 kg/m  Physical Exam Vitals and nursing note reviewed.  Constitutional:      Appearance: Normal appearance.  HENT:     Head: Atraumatic.  Cardiovascular:     Comments: 2+ dorsalis pedis pulses Pulmonary:     Effort: Pulmonary effort is normal.  Musculoskeletal:     Comments: Right knee with no obvious deformity, erythema, edema.  No warmth to the knee upon palpation. There is an overlying well-healed surgical scar along the anterior of the knee. Able to bend right knee to approximately 100 degrees, bends left knee to approximately 120 degrees Tender to palpation along the MCL.  No tenderness palpation along the quadriceps tendon, patella tendon, distal femur, proximal tibia, patella, popliteal fossa, LCL.  No ligamentous laxity to Lachman's, posterior drawer test,  valgus or varus stress   Able to ambulate without difficulty, mild pain with ambulation  Intact sensation of the right lower extremity   Neurological:     General: No focal deficit present.     Mental Status: He is alert.  Psychiatric:        Mood and Affect: Mood normal.        Behavior: Behavior normal.     ED Results / Procedures / Treatments   Labs (all labs ordered are listed, but only abnormal results are displayed) Labs Reviewed - No data to display  EKG None  Radiology No results found.  Procedures Procedures    Medications Ordered in ED Medications - No data to display  ED Course/ Medical Decision Making/ A&P                                 Medical Decision Making  47 y.o. male with pertinent past medical history of previous right knee surgeries presents to the ED for concern of pain to the medial side of right knee, ongoing for 2 months, worsened yesterday with increased activity  Differential diagnosis includes but is not limited to ligamentous injury,  fracture, dislocation, bursitis, septic arthritis  ED Course:  Patient overall well-appearing, afebrile, not tachycardic.  Patient reports pain to the medial side of his right knee, tender to palpation over the MCL.  No ligamentous laxity with valgus stress.  He reports pain in this area 2 months ago and was evaluated by orthopedics and instructed to wear a brace from the orthopedic provider.  This helped resolve his pain.  Yesterday, he had to walk more than normal and his pain flared back up.  No other acute injuries to the knee.  No indication for x-ray imaging at this time as I doubt fracture or dislocation.  The knee is not edematous or erythematous, able to flex the knee, no vital sign abnormalities, no suspicion for septic arthritis at this time. Patient requests MRI today.  I informed them that he will need to return to his orthopedic provider to get MRI imaging and management of his knee pain.  This is  not an acute problem, he is able to ambulate without difficulty, no ligamentous laxity on exam and we do not have MRI capability here.  He tells me he has an appointment later next week with orthopedics.  He declines anything for pain at this time, declines anything him ambulate such as crutches.  I recommended he keep his appointment with orthopedics, and follow-up with them for further management of his knee pain.  They will be able to determine if further imaging is necessary and follow-up on this imaging if indicated.  Impression: Right medial knee pain  Disposition:  The patient was discharged home with instructions to follow-up with his orthopedic provider for management of his knee pain.  Tylenol as needed for pain. Return precautions given.            Final Clinical Impression(s) / ED Diagnoses Final diagnoses:  Chronic pain of right knee    Rx / DC Orders ED Discharge Orders     None         Arabella Merles, PA-C 12/15/22 1720    Royanne Foots, DO 12/19/22 [phone]

## 2022-12-19 NOTE — Plan of Care (Signed)
[facility] Tonsillectomy/Adenoidectomy, Postoperative PEDS care plan entered in error.

## 2023-08-17 ENCOUNTER — Ambulatory Visit (HOSPITAL_COMMUNITY)
Admission: EM | Admit: 2023-08-17 | Discharge: 2023-08-17 | Disposition: A | Discharge status: Home / Self Care | Attending: Family Medicine | Admitting: Family Medicine

## 2023-08-17 ENCOUNTER — Encounter (HOSPITAL_COMMUNITY): Payer: Self-pay

## 2023-08-17 DIAGNOSIS — L309 Dermatitis, unspecified: Secondary | ICD-10-CM

## 2023-08-17 MED ORDER — METHYLPREDNISOLONE ACETATE 80 MG/ML IJ SUSP
INTRAMUSCULAR | Status: AC
Start: 2023-08-17 — End: 2023-08-17
  Filled 2023-08-17: qty 1

## 2023-08-17 MED ORDER — METHYLPREDNISOLONE ACETATE 80 MG/ML IJ SUSP
80.0000 mg | Freq: Once | INTRAMUSCULAR | Status: AC
  Administered 2023-08-17: 80 mg via INTRAMUSCULAR

## 2023-08-17 NOTE — Discharge Instructions (Signed)
 Meds ordered this encounter  Medications   methylPREDNISolone acetate (DEPO-MEDROL) injection 80 mg

## 2023-08-17 NOTE — ED Triage Notes (Signed)
 Patient presenting with rash on the right hip and arm posterior onset 4 days ago and spreading. Patient states he thinks he got bit by something.  No new products used.  Prescriptions or OTC medications tried: Yes- otc poison oak cream, blue star ointment    with no relief

## 2023-08-22 NOTE — ED Provider Notes (Signed)
 [facility]   [id number] 08/17/23 Arrival Time: 1923  ASSESSMENT & PLAN:  1. Dermatitis    Unclear etiology; likely contact. Without signs of infection.  Meds ordered this encounter  Medications   methylPREDNISolone  acetate (DEPO-MEDROL ) injection 80 mg    Will follow up with PCP or here if worsening or failing to improve as anticipated. Reviewed expectations re: course of current medical issues. Questions answered. Outlined signs and symptoms indicating need for more acute intervention. Patient verbalized understanding. After Visit Summary given.   SUBJECTIVE:  Bruce Bailey is a 48 y.o. male who presents with a skin complaint. Patient presenting with rash on the right hip and arm posterior onset 4 days ago and spreading. Patient states he thinks he got bit by something.  No new products used.  Prescriptions or OTC medications tried: Yes- otc poison oak cream, blue star ointment    with no relief    OBJECTIVE: Vitals:   08/17/23 1937  BP: 114/72  Pulse: 60  Resp: 16  Temp: 98.2 F (36.8 C)  TempSrc: Oral  SpO2: 94%  Weight: 102.1 kg  Height: 5' 8 (1.727 m)    General appearance: alert; no distress HEENT: [name]; AT Neck: supple with FROM Extremities: no edema; moves all extremities normally Skin: warm and dry; areas of linear papules and vesicles with surrounding erythema over R hip and R arm Psychological: alert and cooperative; normal mood and affect  No Known Allergies  Past Medical History:  Diagnosis Date   Acute appendicitis without mention of peritonitis 06/21/2012   Cough    Headache    SOB (shortness of breath)    Social History   Socioeconomic History   Marital status: Married    Spouse name: Bruce Bailey   Number of children: 3   Years of education: college   Highest education level: Not on file  Occupational History   Not on file  Tobacco Use   Smoking status: Never   Smokeless tobacco: Never  Vaping Use   Vaping status: Never Used   Substance and Sexual Activity   Alcohol use: No   Drug use: No   Sexual activity: Yes    Birth control/protection: Surgical  Other Topics Concern   Not on file  Social History Narrative   07/10/19   [place] came to [facility] for school [employer]   Living: with wife Bruce Bailey (2001) and 2 children   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      Family: 3 children - Bruce Bailey and Bruce Bailey and Bruce Bailey (out of the house)       Enjoys: work keeps him busy      Exercise: walking daily w/ work, Systems analyst    Diet: waxed and wane, has been better      Safety   Seat belts: Yes    Guns: No   Safe in relationships: Yes    Social Drivers of Corporate investment banker Strain: Not on file  Food Insecurity: No Food Insecurity (01/05/2022)   Hunger Vital Sign    Worried About Running Out of Food in the Last Year: Never true    Ran Out of Food in the Last Year: Never true  Transportation Needs: No Transportation Needs (01/05/2022)   PRAPARE - Administrator, Civil Service (Medical): No    Lack of Transportation (Non-Medical): No  Physical Activity: Not on file  Stress: Not on file  Social Connections: Not on file  Intimate Partner Violence: Not on file   Family History  Problem Relation Age of Onset   Diabetes Mother    Diabetes Father    Stroke Father 85   Stroke Maternal Grandmother    Heart attack Maternal Grandmother 55   Heart disease Maternal Uncle    Past Surgical History:  Procedure Laterality Date   APPENDECTOMY     knee surgery x 3      LAPAROSCOPIC APPENDECTOMY N/A 06/21/2012   Procedure: APPENDECTOMY LAPAROSCOPIC;  Surgeon: Bruce GORMAN Curvin DOUGLAS, MD;  Bruce: [facility] OR;  Service: General;  Laterality: N/A;   OTHER SURGICAL HISTORY Bilateral    ACL times 3   VASECTOMY  07/15/2016      Rolinda Rogue, MD 08/22/23 1204

## 2023-11-06 ENCOUNTER — Ambulatory Visit (INDEPENDENT_AMBULATORY_CARE_PROVIDER_SITE_OTHER)

## 2023-11-06 ENCOUNTER — Ambulatory Visit: Payer: Self-pay

## 2023-11-06 VITALS — BP 130/72 | HR 60 | Temp 98.0°F | Ht 68.0 in | Wt 240.5 lb

## 2023-11-06 DIAGNOSIS — M7918 Myalgia, other site: Secondary | ICD-10-CM

## 2023-11-06 DIAGNOSIS — R631 Polydipsia: Secondary | ICD-10-CM

## 2023-11-06 DIAGNOSIS — R5383 Other fatigue: Secondary | ICD-10-CM

## 2023-11-06 LAB — CBC WITH DIFFERENTIAL/PLATELET
Basophils Absolute: 0 K/uL (ref 0.0–0.1)
Basophils Relative: 0.6 % (ref 0.0–3.0)
Eosinophils Absolute: 0.1 K/uL (ref 0.0–0.7)
Eosinophils Relative: 2 % (ref 0.0–5.0)
HCT: 39.8 % (ref 39.0–52.0)
Hemoglobin: 13.5 g/dL (ref 13.0–17.0)
Lymphocytes Relative: 38.6 % (ref 12.0–46.0)
Lymphs Abs: 1.9 K/uL (ref 0.7–4.0)
MCHC: 33.9 g/dL (ref 30.0–36.0)
MCV: 93.4 fl (ref 78.0–100.0)
Monocytes Absolute: 0.4 K/uL (ref 0.1–1.0)
Monocytes Relative: 7.6 % (ref 3.0–12.0)
Neutro Abs: 2.5 K/uL (ref 1.4–7.7)
Neutrophils Relative %: 51.2 % (ref 43.0–77.0)
Platelets: 200 K/uL (ref 150.0–400.0)
RBC: 4.27 Mil/uL (ref 4.22–5.81)
RDW: 14.4 % (ref 11.5–15.5)
WBC: 4.8 K/uL (ref 4.0–10.5)

## 2023-11-06 LAB — BASIC METABOLIC PANEL WITH GFR
BUN: 12 mg/dL (ref 6–23)
CO2: 30 meq/L (ref 19–32)
Calcium: 9.6 mg/dL (ref 8.4–10.5)
Chloride: 104 meq/L (ref 96–112)
Creatinine, Ser: 1.18 mg/dL (ref 0.40–1.50)
GFR: 73.83 mL/min (ref >60.00)
Glucose, Bld: 93 mg/dL (ref 70–99)
Potassium: 4 meq/L (ref 3.5–5.1)
Sodium: 140 meq/L (ref 135–145)

## 2023-11-06 LAB — HEMOGLOBIN A1C: Hgb A1c MFr Bld: 6.7 % — ABNORMAL HIGH (ref 4.6–6.5)

## 2023-11-06 MED ORDER — METHOCARBAMOL 500 MG PO TABS: 1000.0000 mg | ORAL_TABLET | Freq: Three times a day (TID) | ORAL | 0 refills | Status: DC | PRN

## 2023-11-06 NOTE — Progress Notes (Signed)
 Subjective:    Patient ID: Bruce Bailey, male    DOB: 05-Oct-1975, 48 y.o.   MRN: [id number]   Bruce Bailey is a very pleasant 48 y.o. male who presents today as a TOC from last PCP.  Past medical, surgical and family history: Reviewed and updated in chart.  Allergies: Reviewed and updated in chart.  Medications: Reviewed and updated in chart.  Social history: Reviewed and updated in chart.  Last PCP and reason for leaving: Was here at [facility] several yrs ago, can't remember who but didn't come for a long time  Today, works long hours, outside for long periods cramps in pper arms and calfs BL, 64oz bottle, most days drinks half only sometimes the full thing, sometimes feels pain/muscle spasms in his upper muscles left abdomen, tenses up to physical pain, cramps happening in May-July, 3-4 times weekly, tries to drink 4-5 20oz liquid IVs and helped a little to reduce frequency of cramps, cramps lasting few mins, mostly occur at night. Occurring for the last 2y only in hotter weather, when weather is cooler no sx at all. Works 12-18hrs 6 days a week, mostly a lot of walking and bending, mostly cutting grass so long periods outdoors  Endorses polyuria and polydipsia, fatigued.    Review of Systems  All other systems reviewed and are negative.        Past Medical History:  Diagnosis Date   Acute appendicitis without mention of peritonitis 06/21/2012   Cough    Headache    SOB (shortness of breath)     Social History   Socioeconomic History   Marital status: Married    Spouse name: Devere   Number of children: 3   Years of education: college   Highest education level: Not on file  Occupational History   Not on file  Tobacco Use   Smoking status: Never   Smokeless tobacco: Never  Vaping Use   Vaping status: Never Used  Substance and Sexual Activity   Alcohol use: No   Drug use: No   Sexual activity: Yes    Birth control/protection: Surgical  Other Topics Concern   Not  on file  Social History Narrative   07/10/19   [place] but came to [facility] for school [employer]   Living: with wife susan (2001) and 2 adult children   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      Family: 3 children - DJ and [place] and [place] (out of the house)       Enjoys: work keeps him busy      Exercise: walking daily w/ work, Systems analyst    Diet: waxed and wane, has been better      IT sales professional belts: Yes    Guns: No   Safe in relationships: Yes    Social Drivers of Corporate investment banker Strain: Not on file  Food Insecurity: No Food Insecurity (01/05/2022)   Hunger Vital Sign    Worried About Running Out of Food in the Last Year: Never true    Ran Out of Food in the Last Year: Never true  Transportation Needs: No Transportation Needs (01/05/2022)   PRAPARE - Administrator, Civil Service (Medical): No    Lack of Transportation (Non-Medical): No  Physical Activity: Not on file  Stress: Not on file  Social Connections: Not on file  Intimate Partner Violence: Not on  file    Past Surgical History:  Procedure Laterality Date   APPENDECTOMY     knee surgery x 3      2 on each knee, former football player   LAPAROSCOPIC APPENDECTOMY N/A 06/21/2012   Procedure: APPENDECTOMY LAPAROSCOPIC;  Surgeon: Deward GORMAN Curvin DOUGLAS, MD;  Location: [facility] OR;  Service: General;  Laterality: N/A;   OTHER SURGICAL HISTORY Bilateral    ACL times 3   VASECTOMY  07/15/2016    Family History  Problem Relation Age of Onset   Diabetes Mother    Diabetes Father    Stroke Father 72   Stroke Maternal Grandmother    Heart attack Maternal Grandmother 55   Heart disease Maternal Uncle     No Known Allergies  No current outpatient medications on file prior to visit.   No current facility-administered medications on file prior to visit.    BP 130/72   Pulse 60   Temp 98 F (36.7 C) (Oral)   Ht 5' 8 (1.727 m)    Wt 240 lb 8 oz (109.1 kg)   SpO2 98%   BMI 36.57 kg/m   Objective:    Physical Exam Vitals and nursing note reviewed.  Constitutional:      Appearance: He is obese.  HENT:     Head: Normocephalic and atraumatic.  Eyes:     Extraocular Movements: Extraocular movements intact.     Conjunctiva/sclera: Conjunctivae normal.  Cardiovascular:     Rate and Rhythm: Normal rate and regular rhythm.     Heart sounds: No murmur heard. Pulmonary:     Effort: Pulmonary effort is normal.     Breath sounds: No wheezing or rales.  Skin:    General: Skin is warm.  Neurological:     Mental Status: He is alert.  Psychiatric:        Mood and Affect: Mood normal.        Behavior: Behavior normal.            Assessment & Plan:   1. Myofascial pain (Primary) Patient complaining of muscle cramping, associated with some stiffness, pain and sometimes a knot in his abdominal muscle.  Cramping in his lower forearm, and upper legs is likely secondary to a combination of physically taxing work mowing lawns outdoors, as well as long working days.  Will treat conservatively with increased hydration, ice/heat pack, as well as physical massages to relieve muscle tension.  Further encouraged him to eat bananas in the morning before starting his day, which can help to prevent cramps.  Will order muscle relaxant for as needed use below, counseled patient on potential side effects and he opts to proceed.  Further ordered BMP to rule out electrolyte imbalances. If symptoms do not respond to conservative measures, could consider ruling out autoimmune causes with an ESR and CRP at a later time.  - Basic Metabolic Panel - methocarbamol  (ROBAXIN ) 500 MG tablet; Take 2 tablets (1,000 mg total) by mouth every 8 (eight) hours as needed for muscle spasms. 1-2 tablets for muscle stiffness/cramps  Dispense: 30 tablet; Refill: 0  2. Polydipsia Could be secondary to long hours spent working outdoors with inadequate  hydration, however, will rule out diabetes as a potential cause, per chart review, he has been in the prediabetic range with A1c as high as 5.9, most recent 1 from over a year ago was 5.6, will repeat.   - Hemoglobin A1c  3. Other fatigue Unclear etiology at this time, will order CBC  to rule out anemia as a potential cause.  - CBC with Differential   Return in about 4 weeks (around 12/04/2023) for CPE and cramps follow up.   Bruce Dethloff K Chloee Tena, MD  11/06/23

## 2023-11-06 NOTE — Patient Instructions (Addendum)
 Thank you for visiting [facility] today! Here's what we talked about: - Drink 2L of water a day, supplement with liquid IVs - Stretch problem areas for 10-47mins at a time each day - When muscles are cramped/stiff apply ice/heat pack or cream and massage the area for 5-10 mins to loosen muscle tension   - Use muscle relaxant as needed.

## 2023-11-15 ENCOUNTER — Ambulatory Visit (INDEPENDENT_AMBULATORY_CARE_PROVIDER_SITE_OTHER): Admitting: General Practice

## 2023-11-15 ENCOUNTER — Emergency Department (HOSPITAL_COMMUNITY)

## 2023-11-15 ENCOUNTER — Encounter (HOSPITAL_COMMUNITY): Payer: Self-pay

## 2023-11-15 ENCOUNTER — Ambulatory Visit: Payer: Self-pay

## 2023-11-15 ENCOUNTER — Encounter: Payer: Self-pay | Admitting: General Practice

## 2023-11-15 ENCOUNTER — Other Ambulatory Visit: Payer: Self-pay

## 2023-11-15 ENCOUNTER — Inpatient Hospital Stay (HOSPITAL_COMMUNITY)
Admission: EM | Admit: 2023-11-15 | Discharge: 2023-11-18 | DRG: 392 | Disposition: A | Discharge status: Home / Self Care | Attending: Internal Medicine | Admitting: Internal Medicine

## 2023-11-15 VITALS — BP 122/70 | HR 96 | Temp 100.8°F | Ht 68.0 in | Wt 227.6 lb

## 2023-11-15 DIAGNOSIS — R350 Frequency of micturition: Secondary | ICD-10-CM

## 2023-11-15 DIAGNOSIS — R3915 Urgency of urination: Secondary | ICD-10-CM

## 2023-11-15 DIAGNOSIS — Z8249 Family history of ischemic heart disease and other diseases of the circulatory system: Secondary | ICD-10-CM

## 2023-11-15 DIAGNOSIS — Z833 Family history of diabetes mellitus: Secondary | ICD-10-CM

## 2023-11-15 DIAGNOSIS — I517 Cardiomegaly: Secondary | ICD-10-CM | POA: Diagnosis present

## 2023-11-15 DIAGNOSIS — R197 Diarrhea, unspecified: Secondary | ICD-10-CM

## 2023-11-15 DIAGNOSIS — E119 Type 2 diabetes mellitus without complications: Secondary | ICD-10-CM | POA: Diagnosis present

## 2023-11-15 DIAGNOSIS — K5720 Diverticulitis of large intestine with perforation and abscess without bleeding: Secondary | ICD-10-CM | POA: Diagnosis not present

## 2023-11-15 DIAGNOSIS — Z823 Family history of stroke: Secondary | ICD-10-CM

## 2023-11-15 DIAGNOSIS — Z6835 Body mass index (BMI) 35.0-35.9, adult: Secondary | ICD-10-CM

## 2023-11-15 DIAGNOSIS — E6609 Other obesity due to excess calories: Secondary | ICD-10-CM | POA: Diagnosis present

## 2023-11-15 DIAGNOSIS — K5792 Diverticulitis of intestine, part unspecified, without perforation or abscess without bleeding: Secondary | ICD-10-CM | POA: Diagnosis present

## 2023-11-15 DIAGNOSIS — E66812 Obesity, class 2: Secondary | ICD-10-CM | POA: Diagnosis present

## 2023-11-15 LAB — URINALYSIS, ROUTINE W REFLEX MICROSCOPIC
Bilirubin Urine: NEGATIVE
Glucose, UA: NEGATIVE mg/dL
Hgb urine dipstick: NEGATIVE
Ketones, ur: 20 mg/dL — AB
Leukocytes,Ua: NEGATIVE
Nitrite: NEGATIVE
Protein, ur: NEGATIVE mg/dL
Specific Gravity, Urine: 1.023 (ref 1.005–1.030)
pH: 5 (ref 5.0–8.0)

## 2023-11-15 LAB — LIPASE, BLOOD: Lipase: 32 U/L (ref 11–51)

## 2023-11-15 LAB — COMPREHENSIVE METABOLIC PANEL WITH GFR
ALT: 34 U/L (ref 0–44)
AST: 29 U/L (ref 15–41)
Albumin: 4.6 g/dL (ref 3.5–5.0)
Alkaline Phosphatase: 58 U/L (ref 38–126)
Anion gap: 16 — ABNORMAL HIGH (ref 5–15)
BUN: 12 mg/dL (ref 6–20)
CO2: 22 mmol/L (ref 22–32)
Calcium: 9.8 mg/dL (ref 8.9–10.3)
Chloride: 100 mmol/L (ref 98–111)
Creatinine, Ser: 1.15 mg/dL (ref 0.61–1.24)
GFR, Estimated: >60 mL/min (ref >60)
Glucose, Bld: 100 mg/dL — ABNORMAL HIGH (ref 70–99)
Potassium: 4 mmol/L (ref 3.5–5.1)
Sodium: 137 mmol/L (ref 135–145)
Total Bilirubin: 1.5 mg/dL — ABNORMAL HIGH (ref 0.0–1.2)
Total Protein: 7.5 g/dL (ref 6.5–8.1)

## 2023-11-15 LAB — CBC WITH DIFFERENTIAL/PLATELET
Abs Immature Granulocytes: 0.09 K/uL — ABNORMAL HIGH (ref 0.00–0.07)
Basophils Absolute: 0 K/uL (ref 0.0–0.1)
Basophils Relative: 0 %
Eosinophils Absolute: 0 K/uL (ref 0.0–0.5)
Eosinophils Relative: 0 %
HCT: 44.8 % (ref 39.0–52.0)
Hemoglobin: 14.4 g/dL (ref 13.0–17.0)
Immature Granulocytes: 1 %
Lymphocytes Relative: 10 %
Lymphs Abs: 1.3 K/uL (ref 0.7–4.0)
MCH: 30.4 pg (ref 26.0–34.0)
MCHC: 32.1 g/dL (ref 30.0–36.0)
MCV: 94.7 fL (ref 80.0–100.0)
Monocytes Absolute: 1 K/uL (ref 0.1–1.0)
Monocytes Relative: 8 %
Neutro Abs: 10.4 K/uL — ABNORMAL HIGH (ref 1.7–7.7)
Neutrophils Relative %: 81 %
Platelets: 202 K/uL (ref 150–400)
RBC: 4.73 MIL/uL (ref 4.22–5.81)
RDW: 12.9 % (ref 11.5–15.5)
WBC: 12.9 K/uL — ABNORMAL HIGH (ref 4.0–10.5)
nRBC: 0 % (ref 0.0–0.2)

## 2023-11-15 LAB — CBG MONITORING, ED: Glucose-Capillary: 103 mg/dL — ABNORMAL HIGH (ref 70–99)

## 2023-11-15 LAB — LACTIC ACID, PLASMA: Lactic Acid, Venous: 0.8 mmol/L (ref 0.5–1.9)

## 2023-11-15 MED ORDER — PIPERACILLIN-TAZOBACTAM 3.375 G IVPB 30 MIN
3.3750 g | Freq: Once | INTRAVENOUS | Status: AC
  Administered 2023-11-16: 3.375 g via INTRAVENOUS
  Filled 2023-11-15: qty 50

## 2023-11-15 MED ORDER — ACETAMINOPHEN 325 MG PO TABS
650.0000 mg | ORAL_TABLET | Freq: Once | ORAL | Status: AC
  Administered 2023-11-15: 650 mg via ORAL
  Filled 2023-11-15: qty 2

## 2023-11-15 MED ORDER — IOHEXOL 300 MG/ML  SOLN
100.0000 mL | Freq: Once | INTRAMUSCULAR | Status: AC | PRN
  Administered 2023-11-15: 100 mL via INTRAVENOUS

## 2023-11-15 MED ORDER — ONDANSETRON HCL 4 MG/2ML IJ SOLN
4.0000 mg | Freq: Once | INTRAMUSCULAR | Status: AC
  Administered 2023-11-16: 4 mg via INTRAVENOUS
  Filled 2023-11-15: qty 2

## 2023-11-15 MED ORDER — MORPHINE SULFATE (PF) 4 MG/ML IV SOLN
4.0000 mg | Freq: Once | INTRAVENOUS | Status: AC
  Administered 2023-11-16: 4 mg via INTRAVENOUS
  Filled 2023-11-15: qty 1

## 2023-11-15 NOTE — Progress Notes (Signed)
 Established Patient Office Visit  Subjective   Patient ID: Bruce Bailey, male    DOB: Dec 29, 1975  Age: 48 y.o. MRN: [id number]  Chief Complaint  Patient presents with   Diarrhea    Since Saturday with abdominal pain and got worse. Patient states yesterday was the worse day.  Patient constantly feels like he has to go to the bathroom; with urinary issues. Patient has been having mucus like stools since Sunday, had a fever yesterday.     Diarrhea  Associated symptoms include abdominal pain, chills, a fever and headaches. Pertinent negatives include no vomiting.    Bruce Bailey is a 48 year old male, patient of Reuben Burkes, MD, presents today for an acute visit.  His wife is also present today.   Discussed the use of AI scribe software for clinical note transcription with the patient, who gave verbal consent to proceed.  History of Present Illness Bruce Bailey is a 48 year old male who presents with worsening abdominal pain and diarrhea. He is accompanied by his wife.  He began experiencing abdominal pain on Saturday, which has progressively worsened. The pain is localized to the lower abdomen and is particularly noticeable when urinating, though there is no burning sensation. He also experiences urinary frequency and urgency.  Since Sunday, he has had mucousy stools without blood. No nausea, vomiting, heartburn, or constipation prior to these symptoms. He developed fever and chills starting yesterday and has had intermittent headaches since the onset of symptoms. Dizziness was noted yesterday.  He has not used antibiotics recently. No significant dietary changes have been made, although he has eliminated bread, sugar, sodas, and sweet tea, opting for grilled chicken instead. He has lost thirteen pounds in the past week and his appetite has decreased.  Patient Active Problem List   Diagnosis Date Noted   Sinus bradycardia 01/06/2022   Class 2 obesity due to excess calories without  serious comorbidity with body mass index (BMI) of 35.0 to 35.9 in adult 01/06/2022   Glucosuria 01/05/2022   Nocturia 01/13/2021   Family history of diabetes mellitus 01/13/2021   Obesity (BMI 30-39.9) 07/10/2019   Enlarged RV (right ventricle) 07/10/2019   Past Medical History:  Diagnosis Date   Acute appendicitis without mention of peritonitis 06/21/2012   Cough    Headache    SOB (shortness of breath)    Past Surgical History:  Procedure Laterality Date   APPENDECTOMY     knee surgery x 3      2 on each knee, former football player   LAPAROSCOPIC APPENDECTOMY N/A 06/21/2012   Procedure: APPENDECTOMY LAPAROSCOPIC;  Surgeon: Deward GORMAN Null III, MD;  Location: [facility] OR;  Service: General;  Laterality: N/A;   OTHER SURGICAL HISTORY Bilateral    ACL times 3   VASECTOMY  07/15/2016   No Known Allergies       11/06/2023   12:05 PM 07/10/2019    8:16 AM 12/24/2015    4:48 PM  Depression screen PHQ 2/9  Decreased Interest 0 0 0  Down, Depressed, Hopeless 0 0 0  PHQ - 2 Score 0 0 0  Altered sleeping 0    Tired, decreased energy 1    Change in appetite 0    Feeling bad or failure about yourself  0    Trouble concentrating 0    Moving slowly or fidgety/restless 0    Suicidal thoughts 0    PHQ-9 Score 1    Difficult doing work/chores Not difficult at all  11/06/2023   12:05 PM  GAD 7 : Generalized Anxiety Score  Nervous, Anxious, on Edge 0  Control/stop worrying 0  Worry too much - different things 0  Trouble relaxing 0  Restless 0  Easily annoyed or irritable 0  Afraid - awful might happen 0  Total GAD 7 Score 0      Review of Systems  Constitutional:  Positive for chills, fever and malaise/fatigue.  Respiratory:  Negative for shortness of breath.   Cardiovascular:  Negative for chest pain.  Gastrointestinal:  Positive for abdominal pain and diarrhea. Negative for constipation, heartburn, nausea and vomiting.  Genitourinary:  Positive for frequency and urgency.  Negative for dysuria.  Neurological:  Positive for dizziness and headaches.  Endo/Heme/Allergies:  Negative for polydipsia.  Psychiatric/Behavioral:  Negative for depression and suicidal ideas. The patient is not nervous/anxious.       Objective:     BP 122/70   Pulse 96   Temp (!) 100.8 F (38.2 C) (Oral)   Ht 5' 8 (1.727 m)   Wt 227 lb 9.6 oz (103.2 kg)   SpO2 98%   BMI 34.61 kg/m  BP Readings from Last 3 Encounters:  11/15/23 122/70  11/06/23 130/72  08/17/23 114/72   Wt Readings from Last 3 Encounters:  11/15/23 227 lb 9.6 oz (103.2 kg)  11/06/23 240 lb 8 oz (109.1 kg)  08/17/23 225 lb (102.1 kg)      Physical Exam Vitals and nursing note reviewed.  Constitutional:      Appearance: He is ill-appearing and diaphoretic.  Cardiovascular:     Rate and Rhythm: Normal rate and regular rhythm.     Pulses: Normal pulses.     Heart sounds: Normal heart sounds.  Pulmonary:     Effort: Pulmonary effort is normal.     Breath sounds: Normal breath sounds.  Abdominal:     General: Abdomen is flat. Bowel sounds are normal. There is no distension.     Palpations: Abdomen is soft.     Tenderness: There is abdominal tenderness.  Skin:    General: Skin is warm.  Neurological:     Mental Status: He is alert and oriented to person, place, and time.  Psychiatric:        Mood and Affect: Mood normal.        Behavior: Behavior normal.        Thought Content: Thought content normal.        Judgment: Judgment normal.      No results found for any visits on 11/15/23.    The 10-year ASCVD risk score (Arnett DK, et al., 2019) is: 4%    Assessment & Plan:  Diarrhea, unspecified type  Urine frequency  Urgency of urination    Assessment and Plan Assessment & Plan Acute diarrhea with dehydration Acute diarrhea with dehydration, significant weight loss, and high suspicion of dehydration requiring immediate intervention. - Refer to ER for IV fluids and further  evaluation. - Avoid ibuprofen  and Motrin .  Urinary frequency and urgency, possible urinary tract infection Urinary frequency and urgency with lower abdominal pain, possible UTI. ER visit recommended for comprehensive evaluation. - Refer to ER for further evaluation and management.   Report called to Patty at [facility] ER.   Return for follow up with PCP after discharge.SABRA Carrol [place], NP

## 2023-11-15 NOTE — ED Provider Notes (Signed)
 [place] EMERGENCY DEPARTMENT AT [facility] Provider Note   CSN: [id number] Arrival date & time: 11/15/23  1534     Patient presents with: Abdominal Pain   Bruce Bailey is a 48 y.o. male with PMHx headaches, who presents to ED concerned for LLQ pain, nausea, constipation x1 week. Also with a couple episodes of vomiting today. Endorses fever around 100F today. Last BM today.  Denies diarrhea, dysuria, hematuria, hematochezia.     Abdominal Pain      Prior to Admission medications   Not on File    Allergies: Patient has no known allergies.    Review of Systems  Gastrointestinal:  Positive for abdominal pain.    Updated Vital Signs BP (!) 122/95 (BP Location: Left Arm)   Pulse 69   Temp 99.4 F (37.4 C) (Oral)   Resp 18   SpO2 100%   Physical Exam Vitals and nursing note reviewed.  Constitutional:      General: He is not in acute distress.    Appearance: He is not ill-appearing or toxic-appearing.  HENT:     Head: Normocephalic and atraumatic.     Mouth/Throat:     Mouth: Mucous membranes are moist.     Pharynx: No posterior oropharyngeal erythema.  Eyes:     General: No scleral icterus.       Right eye: No discharge.        Left eye: No discharge.     Conjunctiva/sclera: Conjunctivae normal.  Cardiovascular:     Rate and Rhythm: Normal rate and regular rhythm.     Pulses: Normal pulses.     Heart sounds: Normal heart sounds. No murmur heard. Pulmonary:     Effort: Pulmonary effort is normal. No respiratory distress.     Breath sounds: Normal breath sounds. No wheezing, rhonchi or rales.  Abdominal:     General: Abdomen is flat. Bowel sounds are normal. There is no distension.     Palpations: Abdomen is soft. There is no mass.     Tenderness: There is abdominal tenderness.     Comments: Lower abdominal tenderness to palpation  Musculoskeletal:     Right lower leg: No edema.     Left lower leg: No edema.  Skin:    General: Skin is warm  and dry.     Findings: No rash.  Neurological:     General: No focal deficit present.     Mental Status: He is alert and oriented to person, place, and time. Mental status is at baseline.  Psychiatric:        Mood and Affect: Mood normal.        Behavior: Behavior normal.     (all labs ordered are listed, but only abnormal results are displayed) Labs Reviewed  CBC WITH DIFFERENTIAL/PLATELET - Abnormal; Notable for the following components:      Result Value   WBC 12.9 (*)    Neutro Abs 10.4 (*)    Abs Immature Granulocytes 0.09 (*)    All other components within normal limits  COMPREHENSIVE METABOLIC PANEL WITH GFR - Abnormal; Notable for the following components:   Glucose, Bld 100 (*)    Total Bilirubin 1.5 (*)    Anion gap 16 (*)    All other components within normal limits  URINALYSIS, ROUTINE W REFLEX MICROSCOPIC - Abnormal; Notable for the following components:   Ketones, ur 20 (*)    All other components within normal limits  CBG MONITORING, ED - Abnormal; Notable  for the following components:   Glucose-Capillary 103 (*)    All other components within normal limits  LIPASE, BLOOD  LACTIC ACID, PLASMA    EKG: None  Radiology: CT ABDOMEN PELVIS W CONTRAST Result Date: 11/15/2023 CLINICAL DATA:  Lower abdominal pain and nausea, initial encounter EXAM: CT ABDOMEN AND PELVIS WITH CONTRAST TECHNIQUE: Multidetector CT imaging of the abdomen and pelvis was performed using the standard protocol following bolus administration of intravenous contrast. RADIATION DOSE REDUCTION: This exam was performed according to the departmental dose-optimization program which includes automated exposure control, adjustment of the mA and/or kV according to patient size and/or use of iterative reconstruction technique. CONTRAST:  OMNIPAQUE  IOHEXOL  300 MG/ML  SOLN COMPARISON:  06/21/2012 FINDINGS: Lower chest: No acute abnormality. Hepatobiliary: No focal liver abnormality is seen. No  gallstones, gallbladder wall thickening, or biliary dilatation. Pancreas: Unremarkable. No pancreatic ductal dilatation or surrounding inflammatory changes. Spleen: Normal in size without focal abnormality. Adrenals/Urinary Tract: Adrenal glands are within normal limits. Kidneys are well visualized bilaterally. No renal calculi or obstructive changes are seen. The bladder is decompressed. Stomach/Bowel: Pericolonic inflammatory changes noted in the sigmoid colon. A small amount of extraluminal air is identified consistent with micro perforation. No significant free air is seen. The remainder of the colon is within normal limits. Small bowel and stomach are unremarkable. Vascular/Lymphatic: No significant vascular findings are present. No enlarged abdominal or pelvic lymph nodes. Reproductive: Prostate is unremarkable. Other: No abdominal wall hernia or abnormality. No abdominopelvic ascites. Musculoskeletal: No acute or significant osseous findings. IMPRESSION: Changes consistent with diverticulitis within the sigmoid colon. No focal abscess is noted although a an area of micro perforation with extraluminal air is seen. No other focal abnormality is noted. Electronically Signed   By: Oneil Devonshire M.D.   On: 11/15/2023 19:23     .Critical Care  Performed by: Hoy Nidia FALCON, PA-C Authorized by: Hoy Nidia FALCON, PA-C   Critical care provider statement:    Critical care time (minutes):  30   Critical care was necessary to treat or prevent imminent or life-threatening deterioration of the following conditions: diverticulitis and micro perforation.   Critical care was time spent personally by me on the following activities:  Development of treatment plan with patient or surrogate, discussions with consultants, evaluation of patient's response to treatment, examination of patient, ordering and review of laboratory studies, ordering and review of radiographic studies, ordering and performing treatments  and interventions, pulse oximetry, re-evaluation of patient's condition and review of old charts   Care discussed with: admitting provider      Medications Ordered in the ED  piperacillin -tazobactam (ZOSYN ) IVPB 3.375 g (has no administration in time range)  morphine  (PF) 4 MG/ML injection 4 mg (has no administration in time range)  ondansetron  (ZOFRAN ) injection 4 mg (has no administration in time range)  acetaminophen  (TYLENOL ) tablet 650 mg (650 mg Oral Given 11/15/23 1711)  iohexol  (OMNIPAQUE ) 300 MG/ML solution 100 mL (100 mLs Intravenous Contrast Given 11/15/23 1808)                                    Medical Decision Making Risk OTC drugs. Prescription drug management. Decision regarding hospitalization.    This patient presents to the ED for concern of abdominal pain, this involves an extensive number of treatment options, and is a complaint that carries with it a high risk of complications and morbidity.  The  differential diagnosis includes gastroenteritis, colitis, small bowel obstruction, appendicitis, cholecystitis, pancreatitis, nephrolithiasis, UTI, pyleonephritis   Co morbidities that complicate the patient evaluation  none   Additional history obtained:  Dr. Bennett PCP   Problem List / ED Course / Critical interventions / Medication management  Patient presented for LLQ abdominal pain, nausea, vomiting, and constipation developing over the past 1 week.  Last BM today.  Physical exam with lower abdominal tenderness to palpation.  Rest of physical exam reassuring.  Patient afebrile with stable vitals. I Ordered, and personally interpreted labs.  Lactic acid within normal limits.  UA not concerning for infection.  CBC with leukocytosis at 12.9.  No anemia.  CMP with slightly elevated T. bili at 1.5.  There is also a slight anion gap at 16.  CBG 103.  Lipase 32. I ordered imaging studies including CT Abd/Pelvis with contrast: evaluate for structural/surgical etiology  of patients' severe abdominal pain. I independently visualized and interpreted imaging and I agree with the radiologist interpretation of diverticulitis of sigmoid colon with microperforation. Provided patient with IV ABX and pain management. Patient will need admission. I requested consultation with the General Surgery provider on-call Dr. Debby,  and discussed lab and imaging findings as well as pertinent plan - they stated that their services are not needed at this time, but will be able to provide help if hospitalist needs it. Consulted with hospitalist on-call Dr. Arthea who agrees to admit patient.  I have reviewed the patients home medicines and have made adjustments as needed  Social Determinants of Health:  none       Final diagnoses:  Diverticulitis of colon with perforation    ED Discharge Orders     None          Hoy Nidia JULIANNA DEVONNA 11/15/23 2353    Raford Lenis, MD 11/16/23 [phone]

## 2023-11-15 NOTE — ED Provider Triage Note (Signed)
 Emergency Medicine Provider Triage Evaluation Note  Bruce Bailey , a 48 y.o. male  was evaluated in triage.  Pt complains of diffuse abdominal pain quite severe for the last 4 days.  Patient was at his doctor's office getting a normal routine checkup when he was found to be just slightly diabetic with an A1c of 6.7.  Since then he has been having a lot of polyuria and polydipsia.  He denies any dysuria, fever but does endorse nausea.  He has been checking his blood sugars at home daily and he states that they have been normal.+  Review of Systems  Positive:  Negative:   Physical Exam  BP 135/83 (BP Location: Left Arm)   Pulse 89   Temp 99.9 F (37.7 C) (Oral)   Resp 18   SpO2 96%  Gen:   Awake, no distress   Resp:  Normal effort  MSK:   Moves extremities without difficulty  Other:    Medical Decision Making  Medically screening exam initiated at 3:55 PM.  Appropriate orders placed.  Asahel Risden was informed that the remainder of the evaluation will be completed by another provider, this initial triage assessment does not replace that evaluation, and the importance of remaining in the ED until their evaluation is complete.     Theotis Peers [place], [place] 11/15/23 [phone]

## 2023-11-15 NOTE — ED Triage Notes (Signed)
 Lower abdominal pain with nausea and constipation since last week. No vomiting. Pt is passing gas.

## 2023-11-15 NOTE — Telephone Encounter (Signed)
 FYI Only or Action Required?: FYI only for provider.  Patient was last seen in primary care on 11/06/2023 by Bennett Reuben POUR, MD.  Called Nurse Triage reporting Diarrhea.  Symptoms began a week ago.  Please note: pt instructed to follow previous nurse's recommendation and attend appt today; pt verbalized understanding.  Copied from CRM [id number]. Topic: Clinical - Pink Word Triage >> Nov 15, 2023 11:22 AM Terri G wrote: Reason for Triage: Patient has been experiencing Uncontrolled diarrhea and abdominal pain and constipation. Callback number [phone] >> Nov 15, 2023 11:23 AM Terri G wrote: Patient has been experiencing Uncontrolled diarrhea and abdominal pain and constipation Answer Assessment - Initial Assessment Questions 1. DIARRHEA SEVERITY: How bad is the diarrhea? How many more stools have you had in the past 24 hours than normal?      Too many to count 2. ONSET: When did the diarrhea begin?      Saturday 3. STOOL DESCRIPTION:  How loose or watery is the diarrhea? What is the stool color? Is there any blood or mucous in the stool?     Watery, loose stool and then constipation at times 4. VOMITING: Are you also vomiting? If Yes, ask: How many times in the past 24 hours?      no 5. ABDOMEN PAIN: Are you having any abdomen pain? If Yes, ask: What does it feel like? (e.g., crampy, dull, intermittent, constant)     Comes and goes  6. ABDOMEN PAIN SEVERITY: If present, ask: How bad is the pain?  (e.g., Scale 1-10; mild, moderate, or severe)     7/10 7. ORAL INTAKE: If vomiting, Have you been able to drink liquids? How much liquids have you had in the past 24 hours?     yes 8. HYDRATION: Any signs of dehydration? (e.g., dry mouth [not just dry lips], too weak to stand, dizziness, new weight loss) When did you last urinate?     Some dehydration 9. EXPOSURE: Have you traveled to a foreign country recently? Have you been exposed to anyone with  diarrhea? Could you have eaten any food that was spoiled?     no 10. ANTIBIOTIC USE: Are you taking antibiotics now or have you taken antibiotics in the past 2 months?       no 11. OTHER SYMPTOMS: Do you have any other symptoms? (e.g., fever, blood in stool)       Body aches, chills, weakness, tired 12. PREGNANCY: Is there any chance you are pregnant? When was your last menstrual period?       No  Pt goes to the bathroom every hour for bowel movement  Protocols used: [facility]

## 2023-11-15 NOTE — Telephone Encounter (Signed)
  FYI Only or Action Required?: FYI only for provider.  Patient was last seen in primary care on 11/06/2023 by Bennett Reuben POUR, MD.  Called Nurse Triage reporting Diarrhea.  Symptoms began 2 days ago.  Interventions attempted: Rest, hydration, or home remedies.  Symptoms are: unchanged.  Triage Disposition: See Physician Within 24 Hours  Patient/caregiver understands and will follow disposition?: Yes  Reason for Disposition  [1] MODERATE diarrhea (e.g., 4-6 times / day more than normal) AND [2] present > 48 hours (2 days)  Answer Assessment - Initial Assessment Questions Wife calling on behalf of patient. States he feels warm, body is hot, chills and old sweats. Denies new foods or medication.  1. DIARRHEA SEVERITY: How bad is the diarrhea? How many more stools have you had in the past 24 hours than normal?      Wife states he's going every 30 minutes  2. ONSET: When did the diarrhea begin?      2 days ago  3. STOOL DESCRIPTION:  How loose or watery is the diarrhea? What is the stool color? Is there any blood or mucous in the stool?     Watery  4. VOMITING: Are you also vomiting? If Yes, ask: How many times in the past 24 hours?      No  5. ABDOMEN PAIN: Are you having any abdomen pain? If Yes, ask: What does it feel like? (e.g., crampy, dull, intermittent, constant)      Feels like moving in lower abdomen comes and goes  7. ORAL INTAKE: If vomiting, Have you been able to drink liquids? How much liquids have you had in the past 24 hours?     Yes, pedialyte and water  8. HYDRATION: Any signs of dehydration? (e.g., dry mouth [not just dry lips], too weak to stand, dizziness, new weight loss) When did you last urinate?     No signs of dehydration  Protocols used: [facility]  Message from [place] G sent at 11/15/2023 11:23 AM EDT  Patient has been experiencing Uncontrolled diarrhea and abdominal pain and constipation

## 2023-11-15 NOTE — ED Notes (Signed)
Sent pts urine to lab.

## 2023-11-15 NOTE — Patient Instructions (Addendum)
 Please go to [facility] ER.  F/u with your PCP after you have been discharged.   Hope you are better soon.   It was a pleasure to see you today!

## 2023-11-16 DIAGNOSIS — Z833 Family history of diabetes mellitus: Secondary | ICD-10-CM | POA: Diagnosis not present

## 2023-11-16 DIAGNOSIS — E66812 Obesity, class 2: Secondary | ICD-10-CM | POA: Diagnosis present

## 2023-11-16 DIAGNOSIS — E6609 Other obesity due to excess calories: Secondary | ICD-10-CM

## 2023-11-16 DIAGNOSIS — Z8249 Family history of ischemic heart disease and other diseases of the circulatory system: Secondary | ICD-10-CM | POA: Diagnosis not present

## 2023-11-16 DIAGNOSIS — Z823 Family history of stroke: Secondary | ICD-10-CM | POA: Diagnosis not present

## 2023-11-16 DIAGNOSIS — K5792 Diverticulitis of intestine, part unspecified, without perforation or abscess without bleeding: Secondary | ICD-10-CM | POA: Diagnosis present

## 2023-11-16 DIAGNOSIS — I517 Cardiomegaly: Secondary | ICD-10-CM | POA: Diagnosis present

## 2023-11-16 DIAGNOSIS — K5720 Diverticulitis of large intestine with perforation and abscess without bleeding: Secondary | ICD-10-CM | POA: Diagnosis present

## 2023-11-16 DIAGNOSIS — Z6835 Body mass index (BMI) 35.0-35.9, adult: Secondary | ICD-10-CM

## 2023-11-16 DIAGNOSIS — E119 Type 2 diabetes mellitus without complications: Secondary | ICD-10-CM | POA: Diagnosis present

## 2023-11-16 LAB — HIV ANTIBODY (ROUTINE TESTING W REFLEX): HIV Screen 4th Generation wRfx: NONREACTIVE

## 2023-11-16 LAB — CBC
HCT: 41.8 % (ref 39.0–52.0)
Hemoglobin: 13.4 g/dL (ref 13.0–17.0)
MCH: 31.5 pg (ref 26.0–34.0)
MCHC: 32.1 g/dL (ref 30.0–36.0)
MCV: 98.1 fL (ref 80.0–100.0)
Platelets: 192 K/uL (ref 150–400)
RBC: 4.26 MIL/uL (ref 4.22–5.81)
RDW: 13.2 % (ref 11.5–15.5)
WBC: 10.8 K/uL — ABNORMAL HIGH (ref 4.0–10.5)
nRBC: 0 % (ref 0.0–0.2)

## 2023-11-16 LAB — BASIC METABOLIC PANEL WITH GFR
Anion gap: 13 (ref 5–15)
BUN: 11 mg/dL (ref 6–20)
CO2: 26 mmol/L (ref 22–32)
Calcium: 9.6 mg/dL (ref 8.9–10.3)
Chloride: 100 mmol/L (ref 98–111)
Creatinine, Ser: 1.28 mg/dL — ABNORMAL HIGH (ref 0.61–1.24)
GFR, Estimated: >60 mL/min (ref >60)
Glucose, Bld: 107 mg/dL — ABNORMAL HIGH (ref 70–99)
Potassium: 4 mmol/L (ref 3.5–5.1)
Sodium: 139 mmol/L (ref 135–145)

## 2023-11-16 LAB — GLUCOSE, CAPILLARY
Glucose-Capillary: 175 mg/dL — ABNORMAL HIGH (ref 70–99)
Glucose-Capillary: 101 mg/dL — ABNORMAL HIGH (ref 70–99)
Glucose-Capillary: 83 mg/dL (ref 70–99)
Glucose-Capillary: 118 mg/dL — ABNORMAL HIGH (ref 70–99)

## 2023-11-16 LAB — CBG MONITORING, ED: Glucose-Capillary: 101 mg/dL — ABNORMAL HIGH (ref 70–99)

## 2023-11-16 MED ORDER — PIPERACILLIN-TAZOBACTAM 3.375 G IVPB
3.3750 g | Freq: Three times a day (TID) | INTRAVENOUS | Status: DC
  Administered 2023-11-16 – 2023-11-18 (×7): 3.375 g via INTRAVENOUS
  Filled 2023-11-16 (×7): qty 50

## 2023-11-16 MED ORDER — ACETAMINOPHEN 650 MG RE SUPP: 650.0000 mg | Freq: Four times a day (QID) | RECTAL | Status: DC | PRN

## 2023-11-16 MED ORDER — INSULIN ASPART 100 UNIT/ML IJ SOLN
0.0000 [IU] | Freq: Three times a day (TID) | INTRAMUSCULAR | Status: DC
  Administered 2023-11-16: 1 [IU] via SUBCUTANEOUS
  Filled 2023-11-16: qty 0.06

## 2023-11-16 MED ORDER — LACTATED RINGERS IV BOLUS
1000.0000 mL | Freq: Once | INTRAVENOUS | Status: AC
  Administered 2023-11-16: 1000 mL via INTRAVENOUS

## 2023-11-16 MED ORDER — ENOXAPARIN SODIUM 40 MG/0.4ML IJ SOSY
40.0000 mg | PREFILLED_SYRINGE | INTRAMUSCULAR | Status: DC
  Administered 2023-11-16 – 2023-11-17 (×2): 40 mg via SUBCUTANEOUS
  Filled 2023-11-16 (×2): qty 0.4

## 2023-11-16 MED ORDER — LACTATED RINGERS IV SOLN: INTRAVENOUS | Status: AC

## 2023-11-16 MED ORDER — HYDROMORPHONE HCL 1 MG/ML IJ SOLN: 0.5000 mg | INTRAMUSCULAR | Status: DC | PRN

## 2023-11-16 MED ORDER — ONDANSETRON HCL 4 MG PO TABS: 4.0000 mg | ORAL_TABLET | Freq: Four times a day (QID) | ORAL | Status: DC | PRN

## 2023-11-16 MED ORDER — ACETAMINOPHEN 325 MG PO TABS: 650.0000 mg | ORAL_TABLET | Freq: Four times a day (QID) | ORAL | Status: DC | PRN

## 2023-11-16 MED ORDER — ONDANSETRON HCL 4 MG/2ML IJ SOLN: 4.0000 mg | Freq: Four times a day (QID) | INTRAMUSCULAR | Status: DC | PRN

## 2023-11-16 MED ORDER — INSULIN ASPART 100 UNIT/ML IJ SOLN
0.0000 [IU] | Freq: Every day | INTRAMUSCULAR | Status: DC
  Filled 2023-11-16: qty 0.05

## 2023-11-16 MED ORDER — OXYCODONE HCL 5 MG PO TABS: 5.0000 mg | ORAL_TABLET | Freq: Four times a day (QID) | ORAL | Status: DC | PRN

## 2023-11-16 NOTE — Plan of Care (Signed)

## 2023-11-16 NOTE — H&P (Signed)
 History and Physical    Patient: Bruce Bailey FMW:[id number] DOB: [birth year] DOA: 11/15/2023 DOS: the patient was seen and examined on 11/16/2023 PCP: Bennett Reuben POUR, MD  Patient coming from: Home  Chief Complaint:  Chief Complaint  Patient presents with   Abdominal Pain   HPI: Bruce Bailey is a healthy 48 y.o. male with medical history significant for prediabetes presents with severe abdominal pain and diarrhea over the last few days.  He developed a fever but today his diarrhea seem to have resolved.  The pain was quite intense so he presented to the emergency department for evaluation.   In the ED the patient received CT scan of his abdomen and pelvis which revealed acute diverticulitis with microperforation.  He will be admitted to the hospital receiving IV Zosyn .      Review of Systems: As mentioned in the history of present illness. All other systems reviewed and are negative. Past Medical History:  Diagnosis Date   Acute appendicitis without mention of peritonitis 06/21/2012   Cough    Headache    SOB (shortness of breath)    Past Surgical History:  Procedure Laterality Date   APPENDECTOMY     knee surgery x 3      2 on each knee, former football player   LAPAROSCOPIC APPENDECTOMY N/A 06/21/2012   Procedure: APPENDECTOMY LAPAROSCOPIC;  Surgeon: Deward GORMAN Curvin DOUGLAS, MD;  Location: [facility] OR;  Service: General;  Laterality: N/A;   OTHER SURGICAL HISTORY Bilateral    ACL times 3   VASECTOMY  07/15/2016   Social History:  reports that he has never smoked. He has never used smokeless tobacco. He reports that he does not drink alcohol and does not use drugs.  No Known Allergies  Family History  Problem Relation Age of Onset   Diabetes Mother    Diabetes Father    Stroke Father 50   Stroke Maternal Grandmother    Heart attack Maternal Grandmother 55   Heart disease Maternal Uncle     Prior to Admission medications   Not on File    Physical Exam: Vitals:   11/15/23 1830  11/15/23 2048 11/16/23 0015 11/16/23 0100  BP: 127/70 (!) 122/95 125/83   Pulse: 85 69 65   Resp: 16 18 18    Temp: (!) 100.8 F (38.2 C) 99.4 F (37.4 C)  99.4 F (37.4 C)  TempSrc: Oral Oral  Oral  SpO2: 96% 100% 97%    Physical Exam:  General: No acute distress, well developed, well nourished HEENT: Normocephalic, atraumatic, PERRL Cardiovascular: Normal rate and rhythm. Distal pulses intact. Pulmonary: Normal pulmonary effort, normal breath sounds Gastrointestinal: Nondistended abdomen, soft, non-tender(after Morphine ), hypoactive bowel sounds Musculoskeletal:Normal ROM, no lower ext edema Lymphadenopathy: No cervical LAD. Skin: Skin is warm and dry. Neuro: No focal deficits noted, AAOx3. PSYCH: Attentive and cooperative  Data Reviewed:  Results for orders placed or performed during the hospital encounter of 11/15/23 (from the past 24 hours)  POC CBG, ED     Status: Abnormal   Collection Time: 11/15/23  4:04 PM  Result Value Ref Range   Glucose-Capillary 103 (H) 70 - 99 mg/dL  CBC with Differential     Status: Abnormal   Collection Time: 11/15/23  4:42 PM  Result Value Ref Range   WBC 12.9 (H) 4.0 - 10.5 K/uL   RBC 4.73 4.22 - 5.81 MIL/uL   Hemoglobin 14.4 13.0 - 17.0 g/dL   HCT 55.1 60.9 - 47.9 %   MCV 94.7  80.0 - 100.0 fL   MCH 30.4 26.0 - 34.0 pg   MCHC 32.1 30.0 - 36.0 g/dL   RDW 87.0 88.4 - 84.4 %   Platelets 202 150 - 400 K/uL   nRBC 0.0 0.0 - 0.2 %   Neutrophils Relative % 81 %   Neutro Abs 10.4 (H) 1.7 - 7.7 K/uL   Lymphocytes Relative 10 %   Lymphs Abs 1.3 0.7 - 4.0 K/uL   Monocytes Relative 8 %   Monocytes Absolute 1.0 0.1 - 1.0 K/uL   Eosinophils Relative 0 %   Eosinophils Absolute 0.0 0.0 - 0.5 K/uL   Basophils Relative 0 %   Basophils Absolute 0.0 0.0 - 0.1 K/uL   Immature Granulocytes 1 %   Abs Immature Granulocytes 0.09 (H) 0.00 - 0.07 K/uL  Comprehensive metabolic panel     Status: Abnormal   Collection Time: 11/15/23  4:42 PM  Result Value  Ref Range   Sodium 137 135 - 145 mmol/L   Potassium 4.0 3.5 - 5.1 mmol/L   Chloride 100 98 - 111 mmol/L   CO2 22 22 - 32 mmol/L   Glucose, Bld 100 (H) 70 - 99 mg/dL   BUN 12 6 - 20 mg/dL   Creatinine, Ser 8.84 0.61 - 1.24 mg/dL   Calcium 9.8 8.9 - 89.6 mg/dL   Total Protein 7.5 6.5 - 8.1 g/dL   Albumin  4.6 3.5 - 5.0 g/dL   AST 29 15 - 41 U/L   ALT 34 0 - 44 U/L   Alkaline Phosphatase 58 38 - 126 U/L   Total Bilirubin 1.5 (H) 0.0 - 1.2 mg/dL   GFR, Estimated >39 >39 mL/min   Anion gap 16 (H) 5 - 15  Lipase, blood     Status: None   Collection Time: 11/15/23  4:42 PM  Result Value Ref Range   Lipase 32 11 - 51 U/L  Urinalysis, Routine w reflex microscopic -Urine, Clean Catch     Status: Abnormal   Collection Time: 11/15/23  4:42 PM  Result Value Ref Range   Color, Urine YELLOW YELLOW   APPearance CLEAR CLEAR   Specific Gravity, Urine 1.023 1.005 - 1.030   pH 5.0 5.0 - 8.0   Glucose, UA NEGATIVE NEGATIVE mg/dL   Hgb urine dipstick NEGATIVE NEGATIVE   Bilirubin Urine NEGATIVE NEGATIVE   Ketones, ur 20 (A) NEGATIVE mg/dL   Protein, ur NEGATIVE NEGATIVE mg/dL   Nitrite NEGATIVE NEGATIVE   Leukocytes,Ua NEGATIVE NEGATIVE  Lactic acid, plasma     Status: None   Collection Time: 11/15/23  5:14 PM  Result Value Ref Range   Lactic Acid, Venous 0.8 0.5 - 1.9 mmol/L    CT Abdomen pelvis IMPRESSION: Changes consistent with diverticulitis within the sigmoid colon. No focal abscess is noted although a an area of micro perforation with extraluminal air is seen.   Assessment and Plan: Acute diverticulitis with microperforation  - IV Zosyn  - N.p.o. - IV Fluids - Consider surgical consult if the patient worsens  2. Prediabetes?  Check hemoglobin A1c in AM.       Advance Care Planning:   Code Status: Full Code the patient names his wife as his surrogate decision maker want to be full code.  Consults: none  Family Communication: wife at bedside  Severity of Illness: The  appropriate patient status for this patient is INPATIENT. Inpatient status is judged to be reasonable and necessary in order to provide the required intensity of service to ensure  the patient's safety. The patient's presenting symptoms, physical exam findings, and initial radiographic and laboratory data in the context of their chronic comorbidities is felt to place them at high risk for further clinical deterioration. Furthermore, it is not anticipated that the patient will be medically stable for discharge from the hospital within 2 midnights of admission.   * I certify that at the point of admission it is my clinical judgment that the patient will require inpatient hospital care spanning beyond 2 midnights from the point of admission due to high intensity of service, high risk for further deterioration and high frequency of surveillance required.*  Author: ARTHEA CHILD, MD 11/16/2023 1:16 AM  For on call review [email or web address].

## 2023-11-16 NOTE — Progress Notes (Signed)
 PROGRESS NOTE    Bruce Bailey  FMW:[id number] DOB: 09/04/1975 DOA: 11/15/2023 PCP: Bennett Reuben POUR, MD   Brief Narrative:  Bruce Bailey is a healthy 48 y.o. male with medical history significant for prediabetes and diverticulosis who presents with severe abdominal pain and diarrhea.  Worsening abdominal pain of concern, subsequent CT confirmed diverticulitis with microperforation.  Hospitalist called for admission.  Assessment & Plan:   Principal Problem:   Acute diverticulitis Active Problems:   Enlarged RV (right ventricle)   Class 2 obesity due to excess calories without serious comorbidity with body mass index (BMI) of 35.0 to 35.9 in adult  Acute diverticulitis with microperforation -Does not meet sepsis criteria -Continue Zosyn , clear liquid diet, advance as tolerated -CT confirms microperforation, no clear abscess, hold off on surgical consult given improvement with supportive care.   Diabetes, questionably new diagnosis - Reportedly former diagnosis of prediabetes -A1c 6.7, discussed need for dietary and lifestyle changes -hopefully patient will not require further treatment moving forward - Sliding scale insulin  and hypoglycemic protocol ongoing in the interim  DVT prophylaxis: enoxaparin  (LOVENOX ) injection 40 mg Start: 11/16/23 1000 Code Status:   Code Status: Full Code Family Communication: None present  Status is: Inpatient  Dispo: The patient is from: Home              Anticipated d/c is to: Home              Anticipated d/c date is: 24 to 48 hours              Patient currently not medically stable for discharge  Consultants:  None  Procedures:  None  Antimicrobials:  Zosyn   Subjective: No acute issues or events overnight abdominal pain improving but not yet back to baseline.  Tolerating p.o. quite well with clears, will advance as above.  Otherwise denies nausea vomiting constipation headache fevers chills or chest pain.  Objective: Vitals:    11/16/23 0015 11/16/23 0100 11/16/23 0525 11/16/23 0534  BP: 125/83  117/73   Pulse: 65  (!) 57   Resp: 18  16   Temp:  99.4 F (37.4 C) 98.6 F (37 C)   TempSrc:  Oral Oral   SpO2: 97%  98%   Weight:    103.2 kg  Height:    5' 8 (1.727 m)    Intake/Output Summary (Last 24 hours) at 11/16/2023 0751 Last data filed at 11/16/2023 0535 Gross per 24 hour  Intake 1298.04 ml  Output --  Net 1298.04 ml   Filed Weights   11/16/23 0534  Weight: 103.2 kg    Examination:  General:  Pleasantly resting in bed, No acute distress. HEENT:  Normocephalic atraumatic.  Sclerae nonicteric, noninjected.  Extraocular movements intact bilaterally. Neck:  Without mass or deformity.  Trachea is midline. Lungs:  Clear to auscultate bilaterally without rhonchi, wheeze, or rales. Heart:  Regular rate and rhythm.  Without murmurs, rubs, or gallops. Abdomen:  Soft, minimally tender in the left lower quadrant. Extremities: Without cyanosis, clubbing, edema, or obvious deformity. Skin:  Warm and dry, no erythema.   Data Reviewed: I have personally reviewed following labs and imaging studies  CBC: Recent Labs  Lab 11/15/23 1642 11/16/23 0611  WBC 12.9* 10.8*  NEUTROABS 10.4*  --   HGB 14.4 13.4  HCT 44.8 41.8  MCV 94.7 98.1  PLT 202 192   Basic Metabolic Panel: Recent Labs  Lab 11/15/23 1642 11/16/23 0611  NA 137 139  K 4.0 4.0  CL 100 100  CO2 22 26  GLUCOSE 100* 107*  BUN 12 11  CREATININE 1.15 1.28*  CALCIUM 9.8 9.6   GFR: Estimated Creatinine Clearance: 83.9 mL/min (A) (by C-G formula based on SCr of 1.28 mg/dL (H)). Liver Function Tests: Recent Labs  Lab 11/15/23 1642  AST 29  ALT 34  ALKPHOS 58  BILITOT 1.5*  PROT 7.5  ALBUMIN  4.6   Recent Labs  Lab 11/15/23 1642  LIPASE 32   No results for input(s): AMMONIA in the last 168 hours. Coagulation Profile: No results for input(s): INR, PROTIME in the last 168 hours. Cardiac Enzymes: No results for input(s):  CKTOTAL, CKMB, CKMBINDEX, TROPONINI in the last 168 hours. BNP (last 3 results) No results for input(s): PROBNP in the last 8760 hours. HbA1C: No results for input(s): HGBA1C in the last 72 hours. CBG: Recent Labs  Lab 11/15/23 1604 11/16/23 0200 11/16/23 0725  GLUCAP 103* 101* 175*   Lipid Profile: No results for input(s): CHOL, HDL, LDLCALC, TRIG, CHOLHDL, LDLDIRECT in the last 72 hours. Thyroid  Function Tests: No results for input(s): TSH, T4TOTAL, FREET4, T3FREE, THYROIDAB in the last 72 hours. Anemia Panel: No results for input(s): VITAMINB12, FOLATE, FERRITIN, TIBC, IRON, RETICCTPCT in the last 72 hours. Sepsis Labs: Recent Labs  Lab 11/15/23 1714  LATICACIDVEN 0.8    No results found for this or any previous visit (from the past 240 hours).       Radiology Studies: CT ABDOMEN PELVIS W CONTRAST Result Date: 11/15/2023 CLINICAL DATA:  Lower abdominal pain and nausea, initial encounter EXAM: CT ABDOMEN AND PELVIS WITH CONTRAST TECHNIQUE: Multidetector CT imaging of the abdomen and pelvis was performed using the standard protocol following bolus administration of intravenous contrast. RADIATION DOSE REDUCTION: This exam was performed according to the departmental dose-optimization program which includes automated exposure control, adjustment of the mA and/or kV according to patient size and/or use of iterative reconstruction technique. CONTRAST:  OMNIPAQUE  IOHEXOL  300 MG/ML  SOLN COMPARISON:  06/21/2012 FINDINGS: Lower chest: No acute abnormality. Hepatobiliary: No focal liver abnormality is seen. No gallstones, gallbladder wall thickening, or biliary dilatation. Pancreas: Unremarkable. No pancreatic ductal dilatation or surrounding inflammatory changes. Spleen: Normal in size without focal abnormality. Adrenals/Urinary Tract: Adrenal glands are within normal limits. Kidneys are well visualized bilaterally. No renal calculi or  obstructive changes are seen. The bladder is decompressed. Stomach/Bowel: Pericolonic inflammatory changes noted in the sigmoid colon. A small amount of extraluminal air is identified consistent with micro perforation. No significant free air is seen. The remainder of the colon is within normal limits. Small bowel and stomach are unremarkable. Vascular/Lymphatic: No significant vascular findings are present. No enlarged abdominal or pelvic lymph nodes. Reproductive: Prostate is unremarkable. Other: No abdominal wall hernia or abnormality. No abdominopelvic ascites. Musculoskeletal: No acute or significant osseous findings. IMPRESSION: Changes consistent with diverticulitis within the sigmoid colon. No focal abscess is noted although a an area of micro perforation with extraluminal air is seen. No other focal abnormality is noted. Electronically Signed   By: Oneil Devonshire M.D.   On: 11/15/2023 19:23    Scheduled Meds:  enoxaparin  (LOVENOX ) injection  40 mg Subcutaneous Q24H   insulin  aspart  0-5 Units Subcutaneous QHS   insulin  aspart  0-6 Units Subcutaneous TID WC   Continuous Infusions:  lactated ringers  125 mL/hr at 11/16/23 0301   piperacillin -tazobactam (ZOSYN )  IV       LOS: 0 days   Time spent:  Elsie JAYSON Montclair, DO  [facility]  If 7PM-7AM, please contact night-coverage [email or web address]  11/16/2023, 7:51 AM

## 2023-11-16 NOTE — ED Notes (Signed)
 ED TO INPATIENT HANDOFF REPORT  Name/Age/Gender Bruce Bailey 48 y.o. male  Code Status    Code Status Orders  (From admission, onward)           Start     Ordered   11/16/23 0111  Full code  Continuous       Question:  By:  Answer:  Consent: discussion documented in EHR   11/16/23 0112           Code Status History     Date Active Date Inactive Code Status Order ID Comments User Context   01/05/2022 1933 01/06/2022 2349 Full Code [id number]  Lonzell Emeline HERO, DO ED       Home/SNF/Other Nursing Home  Chief Complaint Acute diverticulitis [K57.92]  Level of Care/Admitting Diagnosis ED Disposition     ED Disposition  Admit   Condition  --   Comment  Hospital Area: [facility] [[id number]]  Level of Care: Med-Surg [16]  May admit patient to [facility] or [facility] if equivalent level of care is available:: Yes  Covid Evaluation: Confirmed COVID Negative  Diagnosis: Acute diverticulitis [8807154]  Admitting Physician: ARTHEA CHILD [[id number]]  Attending Physician: ARTHEA CHILD [[id number]]  Certification:: I certify this patient will need inpatient services for at least 2 midnights  Expected Medical Readiness: 11/20/2023          Medical History Past Medical History:  Diagnosis Date   Acute appendicitis without mention of peritonitis 06/21/2012   Cough    Headache    SOB (shortness of breath)     Allergies No Known Allergies  IV Location/Drains/Wounds Patient Lines/Drains/Airways Status     Active Line/Drains/Airways     Name Placement date Placement time Site Days   Peripheral IV 11/15/23 20 G 1 Anterior;Distal;Right;Upper Arm 11/15/23  1644  Arm  1   Incision - 3 Ports Abdomen 1: Anterior;Umbilicus 2: Left;Lateral 3: Left;Distal 06/21/12  1957  -- 4165            Labs/Imaging Results for orders placed or performed during the hospital encounter of 11/15/23 (from the past 48 hours)  POC CBG, ED     Status: Abnormal    Collection Time: 11/15/23  4:04 PM  Result Value Ref Range   Glucose-Capillary 103 (H) 70 - 99 mg/dL    Comment: Glucose reference range applies only to samples taken after fasting for at least 8 hours.  CBC with Differential     Status: Abnormal   Collection Time: 11/15/23  4:42 PM  Result Value Ref Range   WBC 12.9 (H) 4.0 - 10.5 K/uL   RBC 4.73 4.22 - 5.81 MIL/uL   Hemoglobin 14.4 13.0 - 17.0 g/dL   HCT 55.1 60.9 - 47.9 %   MCV 94.7 80.0 - 100.0 fL   MCH 30.4 26.0 - 34.0 pg   MCHC 32.1 30.0 - 36.0 g/dL   RDW 87.0 88.4 - 84.4 %   Platelets 202 150 - 400 K/uL   nRBC 0.0 0.0 - 0.2 %   Neutrophils Relative % 81 %   Neutro Abs 10.4 (H) 1.7 - 7.7 K/uL   Lymphocytes Relative 10 %   Lymphs Abs 1.3 0.7 - 4.0 K/uL   Monocytes Relative 8 %   Monocytes Absolute 1.0 0.1 - 1.0 K/uL   Eosinophils Relative 0 %   Eosinophils Absolute 0.0 0.0 - 0.5 K/uL   Basophils Relative 0 %   Basophils Absolute 0.0 0.0 - 0.1 K/uL   Immature Granulocytes  1 %   Abs Immature Granulocytes 0.09 (H) 0.00 - 0.07 K/uL    Comment: Performed at [facility], [address], [place], [place] [address]  Comprehensive metabolic panel     Status: Abnormal   Collection Time: 11/15/23  4:42 PM  Result Value Ref Range   Sodium 137 135 - 145 mmol/L   Potassium 4.0 3.5 - 5.1 mmol/L   Chloride 100 98 - 111 mmol/L   CO2 22 22 - 32 mmol/L   Glucose, Bld 100 (H) 70 - 99 mg/dL    Comment: Glucose reference range applies only to samples taken after fasting for at least 8 hours.   BUN 12 6 - 20 mg/dL   Creatinine, Ser 8.84 0.61 - 1.24 mg/dL   Calcium 9.8 8.9 - 89.6 mg/dL   Total Protein 7.5 6.5 - 8.1 g/dL   Albumin  4.6 3.5 - 5.0 g/dL   AST 29 15 - 41 U/L   ALT 34 0 - 44 U/L   Alkaline Phosphatase 58 38 - 126 U/L   Total Bilirubin 1.5 (H) 0.0 - 1.2 mg/dL   GFR, Estimated >39 >39 mL/min    Comment: (NOTE) Calculated using the CKD-EPI Creatinine Equation (2021)    Anion gap 16 (H) 5 - 15     Comment: Performed at [facility], [address] [place], [place] [address]  Lipase, blood     Status: None   Collection Time: 11/15/23  4:42 PM  Result Value Ref Range   Lipase 32 11 - 51 U/L    Comment: Performed at [facility], [address], [place], [place] [address]  Urinalysis, Routine w reflex microscopic -Urine, Clean Catch     Status: Abnormal   Collection Time: 11/15/23  4:42 PM  Result Value Ref Range   Color, Urine YELLOW YELLOW   APPearance CLEAR CLEAR   Specific Gravity, Urine 1.023 1.005 - 1.030   pH 5.0 5.0 - 8.0   Glucose, UA NEGATIVE NEGATIVE mg/dL   Hgb urine dipstick NEGATIVE NEGATIVE   Bilirubin Urine NEGATIVE NEGATIVE   Ketones, ur 20 (A) NEGATIVE mg/dL   Protein, ur NEGATIVE NEGATIVE mg/dL   Nitrite NEGATIVE NEGATIVE   Leukocytes,Ua NEGATIVE NEGATIVE    Comment: Performed at [facility], [address] [place], [place] [address]  Lactic acid, plasma     Status: None   Collection Time: 11/15/23  5:14 PM  Result Value Ref Range   Lactic Acid, Venous 0.8 0.5 - 1.9 mmol/L    Comment: Performed at [facility], [address], [place], [place] [address]  CBG monitoring, ED     Status: Abnormal   Collection Time: 11/16/23  2:00 AM  Result Value Ref Range   Glucose-Capillary 101 (H) 70 - 99 mg/dL    Comment: Glucose reference range applies only to samples taken after fasting for at least 8 hours.   CT ABDOMEN PELVIS W CONTRAST Result Date: 11/15/2023 CLINICAL DATA:  Lower abdominal pain and nausea, initial encounter EXAM: CT ABDOMEN AND PELVIS WITH CONTRAST TECHNIQUE: Multidetector CT imaging of the abdomen and pelvis was performed using the standard protocol following bolus administration of intravenous contrast. RADIATION DOSE REDUCTION: This exam was performed according to the departmental dose-optimization program which includes automated exposure control, adjustment of the mA  and/or kV according to patient size and/or use of iterative reconstruction technique. CONTRAST:  OMNIPAQUE  IOHEXOL  300 MG/ML  SOLN COMPARISON:  06/21/2012 FINDINGS: Lower  chest: No acute abnormality. Hepatobiliary: No focal liver abnormality is seen. No gallstones, gallbladder wall thickening, or biliary dilatation. Pancreas: Unremarkable. No pancreatic ductal dilatation or surrounding inflammatory changes. Spleen: Normal in size without focal abnormality. Adrenals/Urinary Tract: Adrenal glands are within normal limits. Kidneys are well visualized bilaterally. No renal calculi or obstructive changes are seen. The bladder is decompressed. Stomach/Bowel: Pericolonic inflammatory changes noted in the sigmoid colon. A small amount of extraluminal air is identified consistent with micro perforation. No significant free air is seen. The remainder of the colon is within normal limits. Small bowel and stomach are unremarkable. Vascular/Lymphatic: No significant vascular findings are present. No enlarged abdominal or pelvic lymph nodes. Reproductive: Prostate is unremarkable. Other: No abdominal wall hernia or abnormality. No abdominopelvic ascites. Musculoskeletal: No acute or significant osseous findings. IMPRESSION: Changes consistent with diverticulitis within the sigmoid colon. No focal abscess is noted although a an area of micro perforation with extraluminal air is seen. No other focal abnormality is noted. Electronically Signed   By: Oneil Devonshire M.D.   On: 11/15/2023 19:23    Pending Labs Unresulted Labs (From admission, onward)     Start     Ordered   11/16/23 0500  HIV Antibody (routine testing w rflx)  (HIV Antibody (Routine testing w reflex) panel)  Tomorrow morning,   R        11/16/23 0112   11/16/23 0500  Basic metabolic panel  Tomorrow morning,   R        11/16/23 0112   11/16/23 0500  CBC  Tomorrow morning,   R        11/16/23 0112            Vitals/Pain Today's Vitals   11/15/23  1850 11/15/23 2048 11/16/23 0015 11/16/23 0100  BP:  (!) 122/95 125/83   Pulse:  69 65   Resp:  18 18   Temp:  99.4 F (37.4 C)  99.4 F (37.4 C)  TempSrc:  Oral  Oral  SpO2:  100% 97%   PainSc: 0-No pain 0-No pain      Isolation Precautions No active isolations  Medications Medications  enoxaparin  (LOVENOX ) injection 40 mg (has no administration in time range)  ondansetron  (ZOFRAN ) tablet 4 mg (has no administration in time range)    Or  ondansetron  (ZOFRAN ) injection 4 mg (has no administration in time range)  acetaminophen  (TYLENOL ) tablet 650 mg (has no administration in time range)    Or  acetaminophen  (TYLENOL ) suppository 650 mg (has no administration in time range)  piperacillin -tazobactam (ZOSYN ) IVPB 3.375 g (has no administration in time range)  oxyCODONE  (Oxy IR/ROXICODONE ) immediate release tablet 5 mg (has no administration in time range)  HYDROmorphone  (DILAUDID ) injection 0.5 mg (has no administration in time range)  lactated ringers  infusion ( Intravenous New Bag/Given 11/16/23 0301)  insulin  aspart (novoLOG ) injection 0-6 Units (has no administration in time range)  insulin  aspart (novoLOG ) injection 0-5 Units ( Subcutaneous Not Given 11/16/23 0204)  acetaminophen  (TYLENOL ) tablet 650 mg (650 mg Oral Given 11/15/23 1711)  iohexol  (OMNIPAQUE ) 300 MG/ML solution 100 mL (100 mLs Intravenous Contrast Given 11/15/23 1808)  piperacillin -tazobactam (ZOSYN ) IVPB 3.375 g (0 g Intravenous Stopped 11/16/23 0034)  morphine  (PF) 4 MG/ML injection 4 mg (4 mg Intravenous Given 11/16/23 0001)  ondansetron  (ZOFRAN ) injection 4 mg (4 mg Intravenous Given 11/16/23 0000)  lactated ringers  bolus 1,000 mL (1,000 mLs Intravenous New Bag/Given 11/16/23 0149)    Mobility walks

## 2023-11-16 NOTE — Progress Notes (Signed)
   11/16/23 1104  TOC Brief Assessment  Insurance and Status Reviewed  Patient has primary care physician Yes  Home environment has been reviewed resides in a private residence  Prior level of function: Independent  Prior/Current Home Services No current home services  Social Drivers of Health Review SDOH reviewed no interventions necessary  Readmission risk has been reviewed Yes  Transition of care needs no transition of care needs at this time

## 2023-11-16 NOTE — Plan of Care (Signed)

## 2023-11-17 DIAGNOSIS — K5792 Diverticulitis of intestine, part unspecified, without perforation or abscess without bleeding: Secondary | ICD-10-CM | POA: Diagnosis not present

## 2023-11-17 LAB — BASIC METABOLIC PANEL WITH GFR
Anion gap: 11 (ref 5–15)
BUN: 10 mg/dL (ref 6–20)
CO2: 25 mmol/L (ref 22–32)
Calcium: 9.1 mg/dL (ref 8.9–10.3)
Chloride: 104 mmol/L (ref 98–111)
Creatinine, Ser: 1.18 mg/dL (ref 0.61–1.24)
GFR, Estimated: >60 mL/min (ref >60)
Glucose, Bld: 95 mg/dL (ref 70–99)
Potassium: 3.8 mmol/L (ref 3.5–5.1)
Sodium: 140 mmol/L (ref 135–145)

## 2023-11-17 LAB — CBC
HCT: 37.4 % — ABNORMAL LOW (ref 39.0–52.0)
Hemoglobin: 11.7 g/dL — ABNORMAL LOW (ref 13.0–17.0)
MCH: 30.6 pg (ref 26.0–34.0)
MCHC: 31.3 g/dL (ref 30.0–36.0)
MCV: 97.9 fL (ref 80.0–100.0)
Platelets: 208 K/uL (ref 150–400)
RBC: 3.82 MIL/uL — ABNORMAL LOW (ref 4.22–5.81)
RDW: 13 % (ref 11.5–15.5)
WBC: 5.8 K/uL (ref 4.0–10.5)
nRBC: 0 % (ref 0.0–0.2)

## 2023-11-17 LAB — GLUCOSE, CAPILLARY
Glucose-Capillary: 91 mg/dL (ref 70–99)
Glucose-Capillary: 86 mg/dL (ref 70–99)
Glucose-Capillary: 131 mg/dL — ABNORMAL HIGH (ref 70–99)
Glucose-Capillary: 151 mg/dL — ABNORMAL HIGH (ref 70–99)

## 2023-11-17 MED ORDER — POLYETHYLENE GLYCOL 3350 17 G PO PACK
17.0000 g | PACK | Freq: Two times a day (BID) | ORAL | Status: DC
  Administered 2023-11-17: 17 g via ORAL
  Filled 2023-11-17 (×2): qty 1

## 2023-11-17 MED ORDER — SENNOSIDES-DOCUSATE SODIUM 8.6-50 MG PO TABS
1.0000 | ORAL_TABLET | Freq: Two times a day (BID) | ORAL | Status: DC
  Administered 2023-11-17: 1 via ORAL
  Filled 2023-11-17 (×2): qty 1

## 2023-11-17 NOTE — Progress Notes (Signed)
 PROGRESS NOTE    Bruce Bailey  FMW:[id number] DOB: 1975/03/29 DOA: 11/15/2023 PCP: Bennett Reuben POUR, MD   Brief Narrative:  Bruce Bailey is a healthy 48 y.o. male with medical history significant for prediabetes and diverticulosis who presents with severe abdominal pain and diarrhea.  Worsening abdominal pain of concern, subsequent CT confirmed diverticulitis with microperforation.  Hospitalist called for admission.  Assessment & Plan:   Principal Problem:   Acute diverticulitis Active Problems:   Enlarged RV (right ventricle)   Class 2 obesity due to excess calories without serious comorbidity with body mass index (BMI) of 35.0 to 35.9 in adult  Acute diverticulitis with microperforation, resolving - Does not meet sepsis criteria -remains afebrile, pain improving - Continue Zosyn , advance diet as tolerated, soft diet ongoing - CT confirms microperforation, no clear abscess, hold off on surgical consult given improvement with supportive care.   Diabetes, questionably new diagnosis - Reportedly former diagnosis of prediabetes -A1c 6.7, discussed need for dietary and lifestyle changes -hopefully patient will not require further treatment moving forward - Sliding scale insulin  and hypoglycemic protocol ongoing in the interim  DVT prophylaxis: enoxaparin  (LOVENOX ) injection 40 mg Start: 11/16/23 1000 Code Status:   Code Status: Full Code Family Communication: None present  Status is: Inpatient  Dispo: The patient is from: Home              Anticipated d/c is to: Home              Anticipated d/c date is: 24 to 48 hours              Patient currently not medically stable for discharge  Consultants:  None  Procedures:  None  Antimicrobials:  Zosyn   Subjective: No acute issues or events overnight abdominal pain improving but not yet back to baseline.  Tolerating p.o. quite well with clears, will advance as above.  Otherwise denies nausea vomiting constipation headache fevers  chills or chest pain.  Objective: Vitals:   11/16/23 0534 11/16/23 1426 11/16/23 2206 11/17/23 0611  BP:  117/70 120/66 120/75  Pulse:  (!) 54 (!) 59 (!) 53  Resp:  16 18 18   Temp:  98.9 F (37.2 C) 98.7 F (37.1 C) 98.6 F (37 C)  TempSrc:  Oral Oral   SpO2:  98% 100% 99%  Weight: 103.2 kg     Height: 5' 8 (1.727 m)       Intake/Output Summary (Last 24 hours) at 11/17/2023 0711 Last data filed at 11/17/2023 0102 Gross per 24 hour  Intake 1178.05 ml  Output --  Net 1178.05 ml   Filed Weights   11/16/23 0534  Weight: 103.2 kg    Examination:  General:  Pleasantly resting in bed, No acute distress. HEENT:  Normocephalic atraumatic.  Sclerae nonicteric, noninjected.  Extraocular movements intact bilaterally. Neck:  Without mass or deformity.  Trachea is midline. Lungs:  Clear to auscultate bilaterally without rhonchi, wheeze, or rales. Heart:  Regular rate and rhythm.  Without murmurs, rubs, or gallops. Abdomen:  Soft, minimally tender in the left lower quadrant. Extremities: Without cyanosis, clubbing, edema, or obvious deformity. Skin:  Warm and dry, no erythema.   Data Reviewed: I have personally reviewed following labs and imaging studies  CBC: Recent Labs  Lab 11/15/23 1642 11/16/23 0611 11/17/23 0541  WBC 12.9* 10.8* 5.8  NEUTROABS 10.4*  --   --   HGB 14.4 13.4 11.7*  HCT 44.8 41.8 37.4*  MCV 94.7 98.1 97.9  PLT 202  192 208   Basic Metabolic Panel: Recent Labs  Lab 11/15/23 1642 11/16/23 0611 11/17/23 0541  NA 137 139 140  K 4.0 4.0 3.8  CL 100 100 104  CO2 22 26 25   GLUCOSE 100* 107* 95  BUN 12 11 10   CREATININE 1.15 1.28* 1.18  CALCIUM 9.8 9.6 9.1   GFR: Estimated Creatinine Clearance: 91.1 mL/min (by C-G formula based on SCr of 1.18 mg/dL). Liver Function Tests: Recent Labs  Lab 11/15/23 1642  AST 29  ALT 34  ALKPHOS 58  BILITOT 1.5*  PROT 7.5  ALBUMIN  4.6   Recent Labs  Lab 11/15/23 1642  LIPASE 32   No results for  input(s): AMMONIA in the last 168 hours. Coagulation Profile: No results for input(s): INR, PROTIME in the last 168 hours. Cardiac Enzymes: No results for input(s): CKTOTAL, CKMB, CKMBINDEX, TROPONINI in the last 168 hours. BNP (last 3 results) No results for input(s): PROBNP in the last 8760 hours. HbA1C: No results for input(s): HGBA1C in the last 72 hours. CBG: Recent Labs  Lab 11/16/23 0200 11/16/23 0725 11/16/23 1130 11/16/23 1622 11/16/23 2201  GLUCAP 101* 175* 101* 83 118*   Lipid Profile: No results for input(s): CHOL, HDL, LDLCALC, TRIG, CHOLHDL, LDLDIRECT in the last 72 hours. Thyroid  Function Tests: No results for input(s): TSH, T4TOTAL, FREET4, T3FREE, THYROIDAB in the last 72 hours. Anemia Panel: No results for input(s): VITAMINB12, FOLATE, FERRITIN, TIBC, IRON, RETICCTPCT in the last 72 hours. Sepsis Labs: Recent Labs  Lab 11/15/23 1714  LATICACIDVEN 0.8    No results found for this or any previous visit (from the past 240 hours).       Radiology Studies: CT ABDOMEN PELVIS W CONTRAST Result Date: 11/15/2023 CLINICAL DATA:  Lower abdominal pain and nausea, initial encounter EXAM: CT ABDOMEN AND PELVIS WITH CONTRAST TECHNIQUE: Multidetector CT imaging of the abdomen and pelvis was performed using the standard protocol following bolus administration of intravenous contrast. RADIATION DOSE REDUCTION: This exam was performed according to the departmental dose-optimization program which includes automated exposure control, adjustment of the mA and/or kV according to patient size and/or use of iterative reconstruction technique. CONTRAST:  OMNIPAQUE  IOHEXOL  300 MG/ML  SOLN COMPARISON:  06/21/2012 FINDINGS: Lower chest: No acute abnormality. Hepatobiliary: No focal liver abnormality is seen. No gallstones, gallbladder wall thickening, or biliary dilatation. Pancreas: Unremarkable. No pancreatic ductal dilatation  or surrounding inflammatory changes. Spleen: Normal in size without focal abnormality. Adrenals/Urinary Tract: Adrenal glands are within normal limits. Kidneys are well visualized bilaterally. No renal calculi or obstructive changes are seen. The bladder is decompressed. Stomach/Bowel: Pericolonic inflammatory changes noted in the sigmoid colon. A small amount of extraluminal air is identified consistent with micro perforation. No significant free air is seen. The remainder of the colon is within normal limits. Small bowel and stomach are unremarkable. Vascular/Lymphatic: No significant vascular findings are present. No enlarged abdominal or pelvic lymph nodes. Reproductive: Prostate is unremarkable. Other: No abdominal wall hernia or abnormality. No abdominopelvic ascites. Musculoskeletal: No acute or significant osseous findings. IMPRESSION: Changes consistent with diverticulitis within the sigmoid colon. No focal abscess is noted although a an area of micro perforation with extraluminal air is seen. No other focal abnormality is noted. Electronically Signed   By: Oneil Devonshire M.D.   On: 11/15/2023 19:23    Scheduled Meds:  enoxaparin  (LOVENOX ) injection  40 mg Subcutaneous Q24H   insulin  aspart  0-5 Units Subcutaneous QHS   insulin  aspart  0-6 Units Subcutaneous TID  WC   Continuous Infusions:  piperacillin -tazobactam (ZOSYN )  IV 3.375 g (11/17/23 0029)     LOS: 1 day   Time spent:  Elsie JAYSON Montclair, DO [facility]  If 7PM-7AM, please contact night-coverage [email or web address]  11/17/2023, 7:11 AM

## 2023-11-17 NOTE — Plan of Care (Signed)
  Problem: Coping: Goal: Ability to adjust to condition or change in health will improve Outcome: Progressing   Problem: Nutritional: Goal: Maintenance of adequate nutrition will improve Outcome: Progressing   Problem: Education: Goal: Knowledge of General Education information will improve Description: Including pain rating scale, medication(s)/side effects and non-pharmacologic comfort measures Outcome: Progressing   Problem: Activity: Goal: Risk for activity intolerance will decrease Outcome: Progressing   Problem: Nutrition: Goal: Adequate nutrition will be maintained Outcome: Progressing   Problem: Coping: Goal: Level of anxiety will decrease Outcome: Progressing

## 2023-11-18 DIAGNOSIS — K5792 Diverticulitis of intestine, part unspecified, without perforation or abscess without bleeding: Secondary | ICD-10-CM | POA: Diagnosis not present

## 2023-11-18 LAB — GLUCOSE, CAPILLARY
Glucose-Capillary: 90 mg/dL (ref 70–99)
Glucose-Capillary: 89 mg/dL (ref 70–99)

## 2023-11-18 MED ORDER — ACETAMINOPHEN 325 MG PO TABS: 650.0000 mg | ORAL_TABLET | Freq: Four times a day (QID) | ORAL | Status: DC | PRN

## 2023-11-18 NOTE — Plan of Care (Signed)
  Problem: Fluid Volume: Goal: Ability to maintain a balanced intake and output will improve Outcome: Completed/Met   Problem: Metabolic: Goal: Ability to maintain appropriate glucose levels will improve Outcome: Completed/Met   Problem: Nutritional: Goal: Maintenance of adequate nutrition will improve Outcome: Adequate for Discharge Goal: Progress toward achieving an optimal weight will improve Outcome: Progressing   Problem: Skin Integrity: Goal: Risk for impaired skin integrity will decrease Outcome: Completed/Met   Problem: Tissue Perfusion: Goal: Adequacy of tissue perfusion will improve Outcome: Completed/Met   Problem: Education: Goal: Knowledge of General Education information will improve Description: Including pain rating scale, medication(s)/side effects and non-pharmacologic comfort measures Outcome: Adequate for Discharge   Problem: Health Behavior/Discharge Planning: Goal: Ability to manage health-related needs will improve Outcome: Adequate for Discharge   Problem: Clinical Measurements: Goal: Ability to maintain clinical measurements within normal limits will improve Outcome: Progressing Goal: Will remain free from infection Outcome: Progressing Goal: Diagnostic test results will improve Outcome: Progressing Goal: Respiratory complications will improve Outcome: Progressing Goal: Cardiovascular complication will be avoided Outcome: Progressing   Problem: Activity: Goal: Risk for activity intolerance will decrease Outcome: Completed/Met   Problem: Nutrition: Goal: Adequate nutrition will be maintained Outcome: Adequate for Discharge   Problem: Coping: Goal: Level of anxiety will decrease Outcome: Completed/Met   Problem: Elimination: Goal: Will not experience complications related to bowel motility Outcome: Completed/Met Goal: Will not experience complications related to urinary retention Outcome: Completed/Met   Problem: Pain  Managment: Goal: General experience of comfort will improve and/or be controlled Outcome: Progressing   Problem: Safety: Goal: Ability to remain free from injury will improve Outcome: Adequate for Discharge   Problem: Skin Integrity: Goal: Risk for impaired skin integrity will decrease Outcome: Adequate for Discharge

## 2023-11-18 NOTE — Discharge Summary (Signed)
 Physician Discharge Summary  Bruce Bailey FMW:[id number] DOB: Mar 24, 1975 DOA: 11/15/2023  PCP: Bennett Reuben POUR, MD  Admit date: 11/15/2023 Discharge date: 11/18/2023  Admitted From: Home Disposition: Home  Recommendations for Outpatient Follow-up:  Follow up with PCP in 1-2 weeks  Home Health: None Equipment/Devices: None  Discharge Condition: Stable CODE STATUS: Full Diet recommendation: Lengthy discussion in regards to patient's diet given his recent change to a all meat diet needed for a well-balanced diet especially in the setting of recent diverticulitis with perforation.  Recommend close follow-up with PCP as well as nutritionist for further discussion.  Would continue to recommend low-salt low-fat low-carb diet at this time  Brief/Interim Summary: Bruce Bailey is a healthy 48 y.o. male with medical history significant for prediabetes and diverticulosis who presents with severe abdominal pain and diarrhea.  Worsening abdominal pain of concern, subsequent CT confirmed diverticulitis with microperforation.  Hospitalist called for admission.  Patient mated as above with acute diverticulitis with microperforation.  Symptoms resolved quite rapidly, no overt signs or symptoms of infection, patient did not meet sepsis criteria.  With symptomatic treatment patient improved, diet was advanced without difficulty or complication.  Antibiotics discontinued given patient's minimal symptoms and he is otherwise stable and agreeable for discharge home.  During hospitalization patient's A1c was rechecked, previously noted to be prediabetic but now given A1c at 6.7 he does meet criteria for diabetes.  Lengthy discussion with family and patient about need for dietary lifestyle improvements and if he is able to reduce his A1c he would then be classified as prediabetic again.  We discussed that his somewhat aggressive dietary swings likely only going to make his abdominal symptoms more unpredictable as well as  his home glucose readings.  Discharge Diagnoses:  Principal Problem:   Acute diverticulitis Active Problems:   Enlarged RV (right ventricle)   Class 2 obesity due to excess calories without serious comorbidity with body mass index (BMI) of 35.0 to 35.9 in adult   Acute diverticulitis with microperforation, resolving - Does not meet sepsis criteria -remains afebrile, pain resolved    Diabetes, questionably new diagnosis - Reportedly former diagnosis of prediabetes -A1c 6.7, discussed need for dietary and lifestyle changes -hopefully patient will not require further treatment moving forward    Discharge Instructions  Discharge Instructions     Call MD for:   Complete by: As directed    Worsening abdominal pain   Call MD for:  difficulty breathing, headache or visual disturbances   Complete by: As directed    Call MD for:  extreme fatigue   Complete by: As directed    Call MD for:  hives   Complete by: As directed    Call MD for:  persistant dizziness or light-headedness   Complete by: As directed    Call MD for:  persistant nausea and vomiting   Complete by: As directed    Call MD for:  severe uncontrolled pain   Complete by: As directed    Call MD for:  temperature >100.4   Complete by: As directed    Diet - low sodium heart healthy   Complete by: As directed    Increase activity slowly   Complete by: As directed       Allergies as of 11/18/2023   No Known Allergies      Medication List     TAKE these medications    acetaminophen  325 MG tablet Commonly known as: TYLENOL  Take 2 tablets (650 mg total) by mouth every  6 (six) hours as needed for mild pain (pain score 1-3) or fever (or Fever >/= 101).        No Known Allergies  Consultations: None  Procedures/Studies: CT ABDOMEN PELVIS W CONTRAST Result Date: 11/15/2023 CLINICAL DATA:  Lower abdominal pain and nausea, initial encounter EXAM: CT ABDOMEN AND PELVIS WITH CONTRAST TECHNIQUE: Multidetector  CT imaging of the abdomen and pelvis was performed using the standard protocol following bolus administration of intravenous contrast. RADIATION DOSE REDUCTION: This exam was performed according to the departmental dose-optimization program which includes automated exposure control, adjustment of the mA and/or kV according to patient size and/or use of iterative reconstruction technique. CONTRAST:  OMNIPAQUE  IOHEXOL  300 MG/ML  SOLN COMPARISON:  06/21/2012 FINDINGS: Lower chest: No acute abnormality. Hepatobiliary: No focal liver abnormality is seen. No gallstones, gallbladder wall thickening, or biliary dilatation. Pancreas: Unremarkable. No pancreatic ductal dilatation or surrounding inflammatory changes. Spleen: Normal in size without focal abnormality. Adrenals/Urinary Tract: Adrenal glands are within normal limits. Kidneys are well visualized bilaterally. No renal calculi or obstructive changes are seen. The bladder is decompressed. Stomach/Bowel: Pericolonic inflammatory changes noted in the sigmoid colon. A small amount of extraluminal air is identified consistent with micro perforation. No significant free air is seen. The remainder of the colon is within normal limits. Small bowel and stomach are unremarkable. Vascular/Lymphatic: No significant vascular findings are present. No enlarged abdominal or pelvic lymph nodes. Reproductive: Prostate is unremarkable. Other: No abdominal wall hernia or abnormality. No abdominopelvic ascites. Musculoskeletal: No acute or significant osseous findings. IMPRESSION: Changes consistent with diverticulitis within the sigmoid colon. No focal abscess is noted although a an area of micro perforation with extraluminal air is seen. No other focal abnormality is noted. Electronically Signed   By: Oneil Devonshire M.D.   On: 11/15/2023 19:23     Subjective: No acute issues or events overnight   Discharge Exam: Vitals:   11/17/23 2217 11/18/23 0630  BP: (!) 144/70 130/84   Pulse: 69 (!) 48  Resp: 15 16  Temp: 98.4 F (36.9 C) 98.1 F (36.7 C)  SpO2: 100% 94%   Vitals:   11/17/23 0611 11/17/23 1350 11/17/23 2217 11/18/23 0630  BP: 120/75 122/77 (!) 144/70 130/84  Pulse: (!) 53 (!) 59 69 (!) 48  Resp: 18 14 15 16   Temp: 98.6 F (37 C) 98.6 F (37 C) 98.4 F (36.9 C) 98.1 F (36.7 C)  TempSrc:   Oral   SpO2: 99% 100% 100% 94%  Weight:      Height:        General: Pt is alert, awake, not in acute distress Cardiovascular: RRR, S1/S2 +, no rubs, no gallops Respiratory: CTA bilaterally, no wheezing, no rhonchi Abdominal: Soft, NT, ND, bowel sounds + Extremities: no edema, no cyanosis    The results of significant diagnostics from this hospitalization (including imaging, microbiology, ancillary and laboratory) are listed below for reference.     Microbiology: No results found for this or any previous visit (from the past 240 hours).   Labs: BNP (last 3 results) No results for input(s): BNP in the last 8760 hours. Basic Metabolic Panel: Recent Labs  Lab 11/15/23 1642 11/16/23 0611 11/17/23 0541  NA 137 139 140  K 4.0 4.0 3.8  CL 100 100 104  CO2 22 26 25   GLUCOSE 100* 107* 95  BUN 12 11 10   CREATININE 1.15 1.28* 1.18  CALCIUM 9.8 9.6 9.1   Liver Function Tests: Recent Labs  Lab 11/15/23 1642  AST 29  ALT 34  ALKPHOS 58  BILITOT 1.5*  PROT 7.5  ALBUMIN  4.6   Recent Labs  Lab 11/15/23 1642  LIPASE 32   No results for input(s): AMMONIA in the last 168 hours. CBC: Recent Labs  Lab 11/15/23 1642 11/16/23 0611 11/17/23 0541  WBC 12.9* 10.8* 5.8  NEUTROABS 10.4*  --   --   HGB 14.4 13.4 11.7*  HCT 44.8 41.8 37.4*  MCV 94.7 98.1 97.9  PLT 202 192 208   Cardiac Enzymes: No results for input(s): CKTOTAL, CKMB, CKMBINDEX, TROPONINI in the last 168 hours. BNP: Invalid input(s): POCBNP CBG: Recent Labs  Lab 11/17/23 1126 11/17/23 1631 11/17/23 2214 11/18/23 0833 11/18/23 1202  GLUCAP 86 131*  151* 90 89   D-Dimer No results for input(s): DDIMER in the last 72 hours. Hgb A1c No results for input(s): HGBA1C in the last 72 hours. Lipid Profile No results for input(s): CHOL, HDL, LDLCALC, TRIG, CHOLHDL, LDLDIRECT in the last 72 hours. Thyroid  function studies No results for input(s): TSH, T4TOTAL, T3FREE, THYROIDAB in the last 72 hours.  Invalid input(s): FREET3 Anemia work up No results for input(s): VITAMINB12, FOLATE, FERRITIN, TIBC, IRON, RETICCTPCT in the last 72 hours. Urinalysis    Component Value Date/Time   COLORURINE YELLOW 11/15/2023 1642   APPEARANCEUR CLEAR 11/15/2023 1642   LABSPEC 1.023 11/15/2023 1642   PHURINE 5.0 11/15/2023 1642   GLUCOSEU NEGATIVE 11/15/2023 1642   HGBUR NEGATIVE 11/15/2023 1642   BILIRUBINUR NEGATIVE 11/15/2023 1642   BILIRUBINUR negative 01/13/2021 1228   KETONESUR 20 (A) 11/15/2023 1642   PROTEINUR NEGATIVE 11/15/2023 1642   UROBILINOGEN 0.2 01/13/2021 1228   UROBILINOGEN 1.0 06/21/2012 1635   NITRITE NEGATIVE 11/15/2023 1642   LEUKOCYTESUR NEGATIVE 11/15/2023 1642   Sepsis Labs Recent Labs  Lab 11/15/23 1642 11/16/23 0611 11/17/23 0541  WBC 12.9* 10.8* 5.8   Microbiology No results found for this or any previous visit (from the past 240 hours).   Time coordinating discharge: Over 30 minutes  SIGNED:   Elsie JAYSON Montclair, DO [facility] 11/18/2023, 3:32 PM Pager   If 7PM-7AM, please contact night-coverage [email or web address]

## 2023-11-18 NOTE — Progress Notes (Signed)
 AVS reviewed w/pt who verbalized an understanding- PIV removed as noted . Pt to follow up with PCP on 9/26. Pt stated he was going to start the Carnivore Diet to decrease his Hgb A1C- pt to discuss w/ PCP - this RN suggested pt look at whole food plant based options or read [employer] for alternative options. Pt finishing lunch and then will d/c to home- son in room- ride home

## 2023-11-20 ENCOUNTER — Telehealth: Payer: Self-pay

## 2023-11-20 NOTE — Telephone Encounter (Signed)
 Copied from CRM #[id number]. Topic: General - Other >> Nov 20, 2023  2:33 PM [place] E wrote: Reason for CRM: Patient called back returning a missed phone, agent located that it was in regards to scheduling a hospital follow up appointment. Patient has a diabetes follow up on 9/26, and patient was adamant about having the hospital follow up at that time of visit as well, if that is doable. Callback number [phone].

## 2023-11-20 NOTE — Transitions of Care (Post Inpatient/ED Visit) (Signed)
   11/20/2023  Name: Sem Mccaughey MRN: [id number] DOB: 10/20/1975  Today's TOC FU Call Status: Today's TOC FU Call Status:: Unsuccessful Call (1st Attempt) Unsuccessful Call (1st Attempt) Date: 11/20/23  Attempted to reach the patient regarding the most recent Inpatient/ED visit.  Follow Up Plan: Additional outreach attempts will be made to reach the patient to complete the Transitions of Care (Post Inpatient/ED visit) call.   Signature  Charmaine Bloodgood, LPN [facility] Health Advisor [facility] You Are. We Are. [facility] Direct Dial [phone]

## 2023-11-21 ENCOUNTER — Telehealth: Payer: Self-pay

## 2023-11-21 NOTE — Transitions of Care (Post Inpatient/ED Visit) (Signed)
   11/21/2023  Name: Bruce Bailey MRN: [id number] DOB: 1975/07/18  Today's TOC FU Call Status: Today's TOC FU Call Status:: Successful TOC FU Call Completed TOC FU Call Complete Date: 11/21/23 Patient's Name and Date of Birth confirmed.  Transition Care Management Follow-up Telephone Call Date of Discharge: 11/19/23 Discharge Facility: [facility]) Type of Discharge: Inpatient Admission Primary Inpatient Discharge Diagnosis:: diverticulitis How have you been since you were released from the hospital?: Better Any questions or concerns?: No  Items Reviewed: Did you receive and understand the discharge instructions provided?: Yes Medications obtained,verified, and reconciled?: Yes (Medications Reviewed) Any new allergies since your discharge?: No Dietary orders reviewed?: Yes Do you have support at home?: Yes People in Home [RPT]: spouse  Medications Reviewed Today: Medications Reviewed Today     Reviewed by Emmitt Pan, LPN (Licensed Practical Nurse) on 11/21/23 at 1422  Med List Status: <None>   Medication Order Taking? Sig Documenting Provider Last Dose Status Informant  acetaminophen  (TYLENOL ) 325 MG tablet [id number] Yes Take 2 tablets (650 mg total) by mouth every 6 (six) hours as needed for mild pain (pain score 1-3) or fever (or Fever >/= 101). Lue Elsie BROCKS, MD  Active             Home Care and Equipment/Supplies: Were Home Health Services Ordered?: NA Any new equipment or medical supplies ordered?: NA  Functional Questionnaire: Do you need assistance with bathing/showering or dressing?: No Do you need assistance with meal preparation?: No Do you need assistance with eating?: No Do you have difficulty maintaining continence: No Do you need assistance with getting out of bed/getting out of a chair/moving?: No Do you have difficulty managing or taking your medications?: No  Follow up appointments reviewed: PCP Follow-up appointment confirmed?:  Yes Date of PCP follow-up appointment?: 11/24/23 Follow-up Provider: [facility] Follow-up appointment confirmed?: NA Do you need transportation to your follow-up appointment?: No Do you understand care options if your condition(s) worsen?: Yes-patient verbalized understanding    SIGNATURE Pan Emmitt, LPN [facility] Nurse Health Advisor Direct Dial [phone]

## 2023-11-22 NOTE — Telephone Encounter (Signed)
 Spoke to pt.

## 2023-11-22 NOTE — Telephone Encounter (Signed)
 Please call patient and reassure him that we will address his hospital follow-up visit on 9/26 when he sees me.

## 2023-11-24 ENCOUNTER — Ambulatory Visit

## 2023-11-24 ENCOUNTER — Ambulatory Visit: Payer: Self-pay

## 2023-11-24 VITALS — BP 118/76 | HR 54 | Temp 97.8°F | Ht 68.0 in | Wt 228.2 lb

## 2023-11-24 DIAGNOSIS — K5792 Diverticulitis of intestine, part unspecified, without perforation or abscess without bleeding: Secondary | ICD-10-CM

## 2023-11-24 DIAGNOSIS — Z789 Other specified health status: Secondary | ICD-10-CM

## 2023-11-24 DIAGNOSIS — D649 Anemia, unspecified: Secondary | ICD-10-CM | POA: Diagnosis not present

## 2023-11-24 DIAGNOSIS — E119 Type 2 diabetes mellitus without complications: Secondary | ICD-10-CM | POA: Diagnosis not present

## 2023-11-24 DIAGNOSIS — Z8719 Personal history of other diseases of the digestive system: Secondary | ICD-10-CM | POA: Insufficient documentation

## 2023-11-24 LAB — CBC WITH DIFFERENTIAL/PLATELET
Basophils Absolute: 0 K/uL (ref 0.0–0.1)
Basophils Relative: 0.7 % (ref 0.0–3.0)
Eosinophils Absolute: 0.1 K/uL (ref 0.0–0.7)
Eosinophils Relative: 2.2 % (ref 0.0–5.0)
HCT: 39 % (ref 39.0–52.0)
Hemoglobin: 13.1 g/dL (ref 13.0–17.0)
Lymphocytes Relative: 37.4 % (ref 12.0–46.0)
Lymphs Abs: 2 K/uL (ref 0.7–4.0)
MCHC: 33.5 g/dL (ref 30.0–36.0)
MCV: 94.4 fl (ref 78.0–100.0)
Monocytes Absolute: 0.5 K/uL (ref 0.1–1.0)
Monocytes Relative: 8.7 % (ref 3.0–12.0)
Neutro Abs: 2.7 K/uL (ref 1.4–7.7)
Neutrophils Relative %: 51 % (ref 43.0–77.0)
Platelets: 309 K/uL (ref 150.0–400.0)
RBC: 4.13 Mil/uL — ABNORMAL LOW (ref 4.22–5.81)
RDW: 13.8 % (ref 11.5–15.5)
WBC: 5.4 K/uL (ref 4.0–10.5)

## 2023-11-24 NOTE — Progress Notes (Signed)
 Subjective:    Patient ID: Bruce Bailey, male    DOB: February 05, 1976, 48 y.o.   MRN: [id number]  HPI  Bruce Bailey is a very pleasant 48 y.o. male who presents today for [facility] visit.  Reason for admission: Acute diverticulitis with microperforation  Patient was admitted from 9/17 to 9/20  Discharge summary reviewed: Yes  Brief summary of hospital events: Patient presented to ED with severe abdominal pain and diarrhea, CT confirmed diverticulitis with microperforation, he was treated symptomatically, received IV antibiotics briefly, improved clinically and was eventually stable for discharge.  Recommendations at discharge: Follow-up with PCP, discontinue all meat diet  Medication list at discharge: Reviewed  Denies any sx today. Patient says he has been hydrating more eat more fiber, more vegetables, says he has BMs daily.  Review of Systems  All other systems reviewed and are negative.        Past Medical History:  Diagnosis Date   Acute appendicitis without mention of peritonitis 06/21/2012   Cough    Headache    SOB (shortness of breath)     Social History   Socioeconomic History   Marital status: Married    Spouse name: Devere   Number of children: 3   Years of education: college   Highest education level: Not on file  Occupational History   Not on file  Tobacco Use   Smoking status: Never   Smokeless tobacco: Never  Vaping Use   Vaping status: Never Used  Substance and Sexual Activity   Alcohol use: No   Drug use: No   Sexual activity: Yes    Birth control/protection: Surgical  Other Topics Concern   Not on file  Social History Narrative   07/10/19   From: [place] but came to [facility] for school [employer]   Living: with wife susan (2001) and 2 adult children   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      Family: 3 children - DJ and [place] and [place] (out of the house)       Enjoys: work  keeps him busy      Exercise: walking daily w/ work, Systems analyst    Diet: waxed and wane, has been better      IT sales professional belts: Yes    Guns: No   Safe in relationships: Yes    Social Drivers of Corporate investment banker Strain: Not on file  Food Insecurity: No Food Insecurity (11/16/2023)   Hunger Vital Sign    Worried About Running Out of Food in the Last Year: Never true    Ran Out of Food in the Last Year: Never true  Transportation Needs: No Transportation Needs (11/16/2023)   PRAPARE - Administrator, Civil Service (Medical): No    Lack of Transportation (Non-Medical): No  Physical Activity: Not on file  Stress: Not on file  Social Connections: Not on file  Intimate Partner Violence: Not At Risk (11/16/2023)   Humiliation, Afraid, Rape, and Kick questionnaire    Fear of Current or Ex-Partner: No    Emotionally Abused: No    Physically Abused: No    Sexually Abused: No    Past Surgical History:  Procedure Laterality Date   APPENDECTOMY     knee surgery x 3      2 on each knee, former football player   LAPAROSCOPIC APPENDECTOMY N/A 06/21/2012   Procedure: APPENDECTOMY LAPAROSCOPIC;  Surgeon: Deward GORMAN Curvin DOUGLAS, MD;  Location: [facility] OR;  Service: General;  Laterality: N/A;   OTHER SURGICAL HISTORY Bilateral    ACL times 3   VASECTOMY  07/15/2016    Family History  Problem Relation Age of Onset   Diabetes Mother    Diabetes Father    Stroke Father 7   Stroke Maternal Grandmother    Heart attack Maternal Grandmother 55   Heart disease Maternal Uncle     No Known Allergies  No current outpatient medications on file prior to visit.   No current facility-administered medications on file prior to visit.    BP 118/76   Pulse (!) 54   Temp 97.8 F (36.6 C) (Oral)   Ht 5' 8 (1.727 m)   Wt 228 lb 4 oz (103.5 kg)   SpO2 97%   BMI 34.71 kg/m   Objective:    Physical Exam Vitals and nursing note reviewed.  Constitutional:      Appearance: Normal  appearance.  HENT:     Head: Normocephalic and atraumatic.  Eyes:     Extraocular Movements: Extraocular movements intact.     Conjunctiva/sclera: Conjunctivae normal.  Skin:    General: Skin is warm.  Neurological:     Mental Status: He is alert.  Psychiatric:        Mood and Affect: Mood normal.        Behavior: Behavior normal.            Assessment & Plan:   1. Transition of care (Primary) 2. Diverticulitis 3. Normocytic anemia Patient was seen today for [facility] visit regarding recent admission for acute diverticulitis with microperforation, see details of hospitalization documented above. Discharge documentation was reviewed and medication reconciliation performed as documented above. Given that symptoms have resolved and no systemic signs of infection, no indication for oral antibiotics at this time, counseled patient on healthy diet changes and warning symptoms to look out for that would warrant reevaluation.  Counseled on the importance of avoiding constipation and maintaining regular BMs. Patient has been educated on their health plan moving forward and verbalized understanding. Per chart review, hospital labs did show anemia at 11.7, most likely dilutional, given significant IV fluids given and the acute nature of the drop, will repeat to ensure resolution.  - CBC with Differential  4. Diabetes mellitus without complication (HCC) A1c from a few weeks ago was 6.7.  Extensively discussed with patient about medical management versus diet and lifestyle, the pros and cons of each.  Given that A1c is within goal being below 7, and patient's personal preference, through process of shared decision making, he opts for diet and lifestyle changes. Extensively counseled about BMI of 34 being an associated risk factor, he declines weight management referral.  Of note, weight is improving, having reduced from 240 pounds earlier this month to 228 pounds as of today.  Extensively discussed  about patient's desire to start OTC dietary supplements, counseled about lack of FDA regulation of supplements, potential for cross-contamination with other chemicals, as well as side effects.  Patient verbalized understanding and opts to proceed.  Counseled him to have low threshold to discontinue should he notice any adverse effects. Plan to repeat A1c in 3 months and reevaluate weight at that time.   Return in about 3 months (around 02/23/2024) for Diabetes and weight.   Sherlonda Flater K Hayes Czaja, MD  11/24/23

## 2023-11-24 NOTE — Patient Instructions (Addendum)
 Thank you for visiting [facility] today! Here's what we talked about: - of exercise every week - Call clinic if: severe abdominal pain, fever, diarrhea,

## 2023-12-22 ENCOUNTER — Encounter

## 2024-02-26 ENCOUNTER — Ambulatory Visit

## 2024-03-20 ENCOUNTER — Other Ambulatory Visit: Payer: Self-pay

## 2024-03-20 DIAGNOSIS — E119 Type 2 diabetes mellitus without complications: Secondary | ICD-10-CM

## 2024-03-22 ENCOUNTER — Other Ambulatory Visit

## 2024-03-22 DIAGNOSIS — E119 Type 2 diabetes mellitus without complications: Secondary | ICD-10-CM

## 2024-03-22 LAB — MICROALBUMIN / CREATININE URINE RATIO
Creatinine,U: 237.3 mg/dL
Microalb Creat Ratio: 3.8 mg/g (ref 0.0–30.0)
Microalb, Ur: 0.9 mg/dL (ref 0.7–1.9)

## 2024-03-22 LAB — HEMOGLOBIN A1C: Hgb A1c MFr Bld: 6 % (ref 4.6–6.5)

## 2024-03-25 ENCOUNTER — Telehealth: Payer: Self-pay

## 2024-03-25 ENCOUNTER — Telehealth

## 2024-03-25 ENCOUNTER — Ambulatory Visit

## 2024-03-25 ENCOUNTER — Ambulatory Visit: Payer: Self-pay

## 2024-03-25 DIAGNOSIS — E785 Hyperlipidemia, unspecified: Secondary | ICD-10-CM

## 2024-03-25 DIAGNOSIS — E119 Type 2 diabetes mellitus without complications: Secondary | ICD-10-CM

## 2024-03-25 NOTE — Progress Notes (Signed)
 "  Ph: [phone] Fax: [phone]   Patient ID: Bruce Bailey, male    DOB: 1975-11-12, 49 y.o.   MRN: [id number]  Virtual visit completed through MyChart, a video enabled telemedicine application. Reviewed limitations, risks, security and privacy concerns of performing a virtual visit and the availability of in person appointments. I also reviewed that there may be a patient responsible charge related to this service. The patient agreed to proceed.  The patient gave informed consent to the use of [employer] AI technology to record the contents of the encounter as documented below.  Patient location: home Provider location: [place] [facility], office Persons participating in this virtual visit: patient, provider Reuben MARLA Burkes, MD   If any vitals were documented, they were collected by patient at home unless specified below.    There were no vitals taken for this visit.   CC: Diabetes  Subjective:   HPI: Bruce Bailey is a 49 y.o. male presenting on 03/25/2024 for No chief complaint on file.   Discussed the use of AI scribe software for clinical note transcription with the patient, who gave verbal consent to proceed.  History of Present Illness He says he has cut back on sodas, bread and candies He says he is not currently exercising, but does walk a lot on his job Denies any numbness or tingling in his feet, denies any vision changes Denies any acute complaints today         Relevant past medical, surgical, family and social history reviewed and updated as indicated. Interim medical history since our last visit reviewed. Allergies and medications reviewed and updated. No outpatient medications prior to visit.   No facility-administered medications prior to visit.     Per HPI unless specifically indicated in ROS section below Review of Systems  All other systems reviewed and are negative.   Objective:   There were no vitals taken for this visit.  Wt Readings  from Last 3 Encounters:  11/24/23 228 lb 4 oz (103.5 kg)  11/16/23 227 lb 9.6 oz (103.2 kg)  11/15/23 227 lb 9.6 oz (103.2 kg)       Physical exam: Gen: alert, NAD, not ill appearing Pulm: speaks in complete sentences without increased work of breathing Psych: normal mood, normal thought content       Results for orders placed or performed in visit on 03/22/24  Microalbumin/Creatinine Ratio, Urine   Collection Time: 03/22/24  9:19 AM  Result Value Ref Range   Microalb, Ur 0.9 0.7 - 1.9 mg/dL   Creatinine,U 762.6 mg/dL   Microalb Creat Ratio 3.8 0.0 - 30.0 mg/g  Hemoglobin A1c   Collection Time: 03/22/24  9:19 AM  Result Value Ref Range   Hgb A1c MFr Bld 6.0 4.6 - 6.5 %     Assessment & Plan:   Assessment & Plan Diabetes mellitus without complication (HCC) Hyperlipidemia, unspecified hyperlipidemia type A1c improved to 6 without medication intervention, will continue with diet/lifestyle to keep things controlled. History of HLD on lipid panel 2 years ago, counseled that given comorbid diabetes, he should be on medication, he declines statin at this time, wants to see what he is cholesterol level is first. See diabetic care below.  Follow-up in 6 months since sugars are well-controlled.   Orders:   Lipid Panel; Future    Current A1c: 6 Last A1c: 6.7 Med regimen: None Urine microalbumin: UTD Eye exam: Needs Foot exam: Deferred due to virtual visit   Ace-i/ARB: Declines Statin: Declines,  would like labs first Influenza and Pneumococcal vaccines: Declines both Neuropathy: No Nephropathy: No Retinopathy: No Diet: Cut down on candy, soft drinks Weight concerns: Improving   I discussed the assessment and treatment plan with the patient. The patient was provided an opportunity to ask questions and all were answered. The patient agreed with the plan and demonstrated an understanding of the instructions. The patient was advised to call back or seek an in-person  evaluation if the symptoms worsen or if the condition fails to improve as anticipated.  Return in about 4 weeks (around 04/22/2024) for CPE, no fasting labs then 31mo from now for diabetes.  Jonae Renshaw K Juandaniel Manfredo, MD  03/25/24     Contains text generated by [employer].    "

## 2024-03-25 NOTE — Telephone Encounter (Signed)
 Pls call pt and schedule the following: - Fasting Lab visit next Monday  - CPE in 4 weeks, no fasting labs - Diabetic visit in 6 months

## 2024-03-25 NOTE — Patient Instructions (Signed)
 Thank you for visiting [facility] virtually today! Here's what we talked about: - Set up diabetic eye exam - Come back next Monday for fasting labs

## 2024-03-25 NOTE — Assessment & Plan Note (Signed)
 A1c improved to 6 without medication intervention, will continue with diet/lifestyle to keep things controlled. History of HLD on lipid panel 2 years ago, counseled that given comorbid diabetes, he should be on medication, he declines statin at this time, wants to see what he is cholesterol level is first. See diabetic care below.  Follow-up in 6 months since sugars are well-controlled.   Orders:   Lipid Panel; Future

## 2024-04-02 NOTE — Telephone Encounter (Signed)
 Called and schedule pts for pt

## 2024-04-11 ENCOUNTER — Other Ambulatory Visit

## 2024-04-30 ENCOUNTER — Ambulatory Visit

## 2024-05-16 ENCOUNTER — Encounter

## 2024-09-30 ENCOUNTER — Ambulatory Visit
